# Patient Record
Sex: Female | Born: 1985 | Race: Black or African American | Hispanic: No | Smoking: Never smoker
Health system: Southern US, Community
[De-identification: ages and names within clinical notes are randomized; demographics above are authoritative.]

## PROBLEM LIST (undated history)

## (undated) ENCOUNTER — Inpatient Hospital Stay: Payer: Self-pay

## (undated) DIAGNOSIS — R519 Headache, unspecified: Secondary | ICD-10-CM

## (undated) DIAGNOSIS — I499 Cardiac arrhythmia, unspecified: Secondary | ICD-10-CM

## (undated) DIAGNOSIS — D649 Anemia, unspecified: Secondary | ICD-10-CM

## (undated) DIAGNOSIS — M1990 Unspecified osteoarthritis, unspecified site: Secondary | ICD-10-CM

## (undated) DIAGNOSIS — R011 Cardiac murmur, unspecified: Secondary | ICD-10-CM

## (undated) DIAGNOSIS — J45909 Unspecified asthma, uncomplicated: Secondary | ICD-10-CM

## (undated) DIAGNOSIS — F32A Depression, unspecified: Secondary | ICD-10-CM

## (undated) DIAGNOSIS — O1490 Unspecified pre-eclampsia, unspecified trimester: Secondary | ICD-10-CM

## (undated) DIAGNOSIS — O139 Gestational [pregnancy-induced] hypertension without significant proteinuria, unspecified trimester: Secondary | ICD-10-CM

## (undated) DIAGNOSIS — R87629 Unspecified abnormal cytological findings in specimens from vagina: Secondary | ICD-10-CM

## (undated) DIAGNOSIS — K219 Gastro-esophageal reflux disease without esophagitis: Secondary | ICD-10-CM

## (undated) DIAGNOSIS — M797 Fibromyalgia: Secondary | ICD-10-CM

## (undated) DIAGNOSIS — F329 Major depressive disorder, single episode, unspecified: Secondary | ICD-10-CM

## (undated) DIAGNOSIS — I209 Angina pectoris, unspecified: Secondary | ICD-10-CM

## (undated) DIAGNOSIS — F4310 Post-traumatic stress disorder, unspecified: Secondary | ICD-10-CM

## (undated) DIAGNOSIS — M5430 Sciatica, unspecified side: Secondary | ICD-10-CM

## (undated) HISTORY — PX: HERNIA REPAIR: SHX51

---
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
## 2006-04-15 ENCOUNTER — Observation Stay: Payer: Self-pay

## 2006-04-16 ENCOUNTER — Inpatient Hospital Stay: Payer: Self-pay

## 2010-05-24 ENCOUNTER — Emergency Department (HOSPITAL_COMMUNITY)
Admission: EM | Admit: 2010-05-24 | Discharge: 2010-05-24 | Payer: Self-pay | Source: Home / Self Care | Admitting: Emergency Medicine

## 2010-06-03 LAB — POCT I-STAT, CHEM 8
BUN: 14 mg/dL (ref 6–23)
Calcium, Ion: 1.18 mmol/L (ref 1.12–1.32)
Chloride: 106 mEq/L (ref 96–112)
Creatinine, Ser: 1.2 mg/dL (ref 0.4–1.2)
Glucose, Bld: 87 mg/dL (ref 70–99)
HCT: 42 % (ref 36.0–46.0)
Hemoglobin: 14.3 g/dL (ref 12.0–15.0)
Potassium: 3.7 mEq/L (ref 3.5–5.1)
Sodium: 139 mEq/L (ref 135–145)
TCO2: 25 mmol/L (ref 0–100)

## 2010-06-03 LAB — URINALYSIS, ROUTINE W REFLEX MICROSCOPIC
Bilirubin Urine: NEGATIVE
Hgb urine dipstick: NEGATIVE
Ketones, ur: NEGATIVE mg/dL
Nitrite: NEGATIVE
Protein, ur: NEGATIVE mg/dL
Specific Gravity, Urine: 1.026 (ref 1.005–1.030)
Urine Glucose, Fasting: NEGATIVE mg/dL
Urobilinogen, UA: 1 mg/dL (ref 0.0–1.0)
pH: 6 (ref 5.0–8.0)

## 2010-06-03 LAB — URINE MICROSCOPIC-ADD ON

## 2010-06-03 LAB — POCT PREGNANCY, URINE: Preg Test, Ur: NEGATIVE

## 2010-09-24 ENCOUNTER — Emergency Department (HOSPITAL_COMMUNITY)
Admission: EM | Admit: 2010-09-24 | Discharge: 2010-09-24 | Disposition: A | Discharge status: Home / Self Care | Payer: Self-pay | Attending: Emergency Medicine | Admitting: Emergency Medicine

## 2010-09-24 DIAGNOSIS — R112 Nausea with vomiting, unspecified: Secondary | ICD-10-CM | POA: Insufficient documentation

## 2010-09-24 DIAGNOSIS — R197 Diarrhea, unspecified: Secondary | ICD-10-CM | POA: Insufficient documentation

## 2011-04-14 ENCOUNTER — Emergency Department: Payer: Self-pay | Admitting: *Deleted

## 2011-05-01 ENCOUNTER — Emergency Department: Payer: Self-pay | Admitting: *Deleted

## 2011-08-23 ENCOUNTER — Emergency Department: Payer: Self-pay | Admitting: *Deleted

## 2011-08-23 LAB — CBC
HCT: 35.5 % (ref 35.0–47.0)
HGB: 11.8 g/dL — ABNORMAL LOW (ref 12.0–16.0)
MCH: 28 pg (ref 26.0–34.0)
MCHC: 33.2 g/dL (ref 32.0–36.0)
MCV: 84 fL (ref 80–100)
Platelet: 255 10*3/uL (ref 150–440)
RBC: 4.21 10*6/uL (ref 3.80–5.20)
RDW: 14.8 % — ABNORMAL HIGH (ref 11.5–14.5)
WBC: 8.6 10*3/uL (ref 3.6–11.0)

## 2011-08-24 LAB — BASIC METABOLIC PANEL
Anion Gap: 7 (ref 7–16)
BUN: 20 mg/dL — ABNORMAL HIGH (ref 7–18)
Calcium, Total: 8.6 mg/dL (ref 8.5–10.1)
Chloride: 106 mmol/L (ref 98–107)
Co2: 27 mmol/L (ref 21–32)
Creatinine: 1.21 mg/dL (ref 0.60–1.30)
EGFR (African American): >60
EGFR (Non-African Amer.): 58 — ABNORMAL LOW
Glucose: 82 mg/dL (ref 65–99)
Osmolality: 281 (ref 275–301)
Potassium: 3.9 mmol/L (ref 3.5–5.1)
Sodium: 140 mmol/L (ref 136–145)

## 2011-08-24 LAB — PRO B NATRIURETIC PEPTIDE: B-Type Natriuretic Peptide: 23 pg/mL (ref 0–125)

## 2011-08-24 LAB — TROPONIN I: Troponin-I: <0.02 ng/mL

## 2011-08-24 LAB — CK TOTAL AND CKMB (NOT AT ARMC)
CK, Total: 187 U/L (ref 21–215)
CK-MB: 0.8 ng/mL (ref 0.5–3.6)

## 2012-05-15 ENCOUNTER — Emergency Department: Payer: Self-pay | Admitting: Emergency Medicine

## 2012-05-15 LAB — CBC WITH DIFFERENTIAL/PLATELET
Basophil #: 0 10*3/uL (ref 0.0–0.1)
Basophil %: 0.2 %
Eosinophil #: 0 10*3/uL (ref 0.0–0.7)
Eosinophil %: 0 %
HCT: 37.2 % (ref 35.0–47.0)
HGB: 12.3 g/dL (ref 12.0–16.0)
Lymphocyte #: 0.4 10*3/uL — ABNORMAL LOW (ref 1.0–3.6)
Lymphocyte %: 4.6 %
MCH: 26.8 pg (ref 26.0–34.0)
MCHC: 32.9 g/dL (ref 32.0–36.0)
MCV: 81 fL (ref 80–100)
Monocyte #: 0.2 x10 3/mm (ref 0.2–0.9)
Monocyte %: 2.4 %
Neutrophil #: 7 10*3/uL — ABNORMAL HIGH (ref 1.4–6.5)
Neutrophil %: 92.8 %
Platelet: 272 10*3/uL (ref 150–440)
RBC: 4.58 10*6/uL (ref 3.80–5.20)
RDW: 14.6 % — ABNORMAL HIGH (ref 11.5–14.5)
WBC: 7.6 10*3/uL (ref 3.6–11.0)

## 2012-05-15 LAB — COMPREHENSIVE METABOLIC PANEL
Albumin: 3.4 g/dL (ref 3.4–5.0)
Alkaline Phosphatase: 111 U/L (ref 50–136)
Anion Gap: 8 (ref 7–16)
BUN: 17 mg/dL (ref 7–18)
Bilirubin,Total: 0.3 mg/dL (ref 0.2–1.0)
Calcium, Total: 8.5 mg/dL (ref 8.5–10.1)
Chloride: 106 mmol/L (ref 98–107)
Co2: 22 mmol/L (ref 21–32)
Creatinine: 1.02 mg/dL (ref 0.60–1.30)
EGFR (African American): >60
EGFR (Non-African Amer.): >60
Glucose: 108 mg/dL — ABNORMAL HIGH (ref 65–99)
Osmolality: 274 (ref 275–301)
Potassium: 3.8 mmol/L (ref 3.5–5.1)
SGOT(AST): 18 U/L (ref 15–37)
SGPT (ALT): 18 U/L (ref 12–78)
Sodium: 136 mmol/L (ref 136–145)
Total Protein: 7.4 g/dL (ref 6.4–8.2)

## 2012-05-15 LAB — RAPID INFLUENZA A&B ANTIGENS

## 2012-07-08 ENCOUNTER — Emergency Department: Payer: Self-pay | Admitting: Emergency Medicine

## 2012-07-08 LAB — PREGNANCY, URINE: Pregnancy Test, Urine: NEGATIVE m[IU]/mL

## 2012-07-08 LAB — CBC
HCT: 35.9 % (ref 35.0–47.0)
HGB: 11.8 g/dL — ABNORMAL LOW (ref 12.0–16.0)
MCH: 27.6 pg (ref 26.0–34.0)
MCHC: 32.8 g/dL (ref 32.0–36.0)
MCV: 84 fL (ref 80–100)
Platelet: 272 10*3/uL (ref 150–440)
RBC: 4.28 10*6/uL (ref 3.80–5.20)
RDW: 15.6 % — ABNORMAL HIGH (ref 11.5–14.5)
WBC: 9.6 10*3/uL (ref 3.6–11.0)

## 2012-07-08 LAB — COMPREHENSIVE METABOLIC PANEL
Albumin: 3.5 g/dL (ref 3.4–5.0)
Alkaline Phosphatase: 107 U/L (ref 50–136)
Anion Gap: 8 (ref 7–16)
BUN: 17 mg/dL (ref 7–18)
Bilirubin,Total: 0.2 mg/dL (ref 0.2–1.0)
Calcium, Total: 8.9 mg/dL (ref 8.5–10.1)
Chloride: 109 mmol/L — ABNORMAL HIGH (ref 98–107)
Co2: 25 mmol/L (ref 21–32)
Creatinine: 1.15 mg/dL (ref 0.60–1.30)
EGFR (African American): >60
EGFR (Non-African Amer.): >60
Glucose: 91 mg/dL (ref 65–99)
Osmolality: 284 (ref 275–301)
Potassium: 3.7 mmol/L (ref 3.5–5.1)
SGOT(AST): 18 U/L (ref 15–37)
SGPT (ALT): 24 U/L (ref 12–78)
Sodium: 142 mmol/L (ref 136–145)
Total Protein: 7.3 g/dL (ref 6.4–8.2)

## 2012-07-08 LAB — URINALYSIS, COMPLETE
Bilirubin,UR: NEGATIVE
Blood: NEGATIVE
Glucose,UR: NEGATIVE mg/dL (ref 0–75)
Ketone: NEGATIVE
Nitrite: POSITIVE
Ph: 5 (ref 4.5–8.0)
Protein: NEGATIVE
RBC,UR: 4 /HPF (ref 0–5)
Specific Gravity: 1.019 (ref 1.003–1.030)
Squamous Epithelial: 5
Transitional Epi: 6
WBC UR: 25 /HPF (ref 0–5)

## 2012-07-08 LAB — LIPASE, BLOOD: Lipase: 198 U/L (ref 73–393)

## 2013-01-05 ENCOUNTER — Emergency Department: Payer: Self-pay | Admitting: Emergency Medicine

## 2013-05-09 ENCOUNTER — Emergency Department: Payer: Self-pay | Admitting: Emergency Medicine

## 2013-05-09 LAB — RAPID INFLUENZA A&B ANTIGENS

## 2013-05-21 ENCOUNTER — Emergency Department: Payer: Self-pay | Admitting: Emergency Medicine

## 2013-05-21 LAB — URINALYSIS, COMPLETE
Bilirubin,UR: NEGATIVE
Glucose,UR: 50 mg/dL (ref 0–75)
Leukocyte Esterase: NEGATIVE
Nitrite: POSITIVE
Ph: 7 (ref 4.5–8.0)
Protein: NEGATIVE
RBC,UR: 1 /HPF (ref 0–5)
Specific Gravity: 1.019 (ref 1.003–1.030)
Squamous Epithelial: 14
WBC UR: 2 /HPF (ref 0–5)

## 2013-06-02 ENCOUNTER — Emergency Department: Payer: Self-pay | Admitting: Emergency Medicine

## 2013-06-02 LAB — COMPREHENSIVE METABOLIC PANEL
Albumin: 3.2 g/dL — ABNORMAL LOW (ref 3.4–5.0)
Alkaline Phosphatase: 77 U/L
Anion Gap: 2 — ABNORMAL LOW (ref 7–16)
BUN: 11 mg/dL (ref 7–18)
Bilirubin,Total: 0.3 mg/dL (ref 0.2–1.0)
Calcium, Total: 8.6 mg/dL (ref 8.5–10.1)
Chloride: 106 mmol/L (ref 98–107)
Co2: 28 mmol/L (ref 21–32)
Creatinine: 1.06 mg/dL (ref 0.60–1.30)
EGFR (African American): >60
EGFR (Non-African Amer.): >60
Glucose: 90 mg/dL (ref 65–99)
Osmolality: 271 (ref 275–301)
Potassium: 3.6 mmol/L (ref 3.5–5.1)
SGOT(AST): 20 U/L (ref 15–37)
SGPT (ALT): 20 U/L (ref 12–78)
Sodium: 136 mmol/L (ref 136–145)
Total Protein: 7.1 g/dL (ref 6.4–8.2)

## 2013-06-02 LAB — URINALYSIS, COMPLETE
Bilirubin,UR: NEGATIVE
Blood: NEGATIVE
Glucose,UR: NEGATIVE mg/dL (ref 0–75)
Ketone: NEGATIVE
Nitrite: NEGATIVE
Ph: 5 (ref 4.5–8.0)
Protein: NEGATIVE
RBC,UR: 7 /HPF (ref 0–5)
Specific Gravity: 1.021 (ref 1.003–1.030)
Squamous Epithelial: 19
WBC UR: 15 /HPF (ref 0–5)

## 2013-06-02 LAB — CBC
HCT: 34.5 % — ABNORMAL LOW (ref 35.0–47.0)
HGB: 11.5 g/dL — ABNORMAL LOW (ref 12.0–16.0)
MCH: 27 pg (ref 26.0–34.0)
MCHC: 33.2 g/dL (ref 32.0–36.0)
MCV: 81 fL (ref 80–100)
Platelet: 249 10*3/uL (ref 150–440)
RBC: 4.24 10*6/uL (ref 3.80–5.20)
RDW: 14.5 % (ref 11.5–14.5)
WBC: 6.1 10*3/uL (ref 3.6–11.0)

## 2013-06-02 LAB — MAGNESIUM: Magnesium: 1.8 mg/dL

## 2013-06-02 LAB — TSH: Thyroid Stimulating Horm: 0.947 u[IU]/mL

## 2013-06-02 LAB — PHOSPHORUS: Phosphorus: 3 mg/dL (ref 2.5–4.9)

## 2013-06-02 LAB — TROPONIN I: Troponin-I: <0.02 ng/mL

## 2013-06-30 ENCOUNTER — Emergency Department: Payer: Self-pay | Admitting: Emergency Medicine

## 2013-06-30 LAB — URINALYSIS, COMPLETE
Bacteria: NONE SEEN
Bilirubin,UR: NEGATIVE
Blood: NEGATIVE
Glucose,UR: NEGATIVE mg/dL (ref 0–75)
Ketone: NEGATIVE
Nitrite: NEGATIVE
Ph: 6 (ref 4.5–8.0)
Protein: NEGATIVE
RBC,UR: 1 /HPF (ref 0–5)
Specific Gravity: 1.025 (ref 1.003–1.030)
Squamous Epithelial: 7
WBC UR: 5 /HPF (ref 0–5)

## 2013-07-02 LAB — URINE CULTURE

## 2013-09-24 ENCOUNTER — Emergency Department: Payer: Self-pay | Admitting: Emergency Medicine

## 2013-09-24 LAB — CBC WITH DIFFERENTIAL/PLATELET
Basophil #: 0.1 10*3/uL (ref 0.0–0.1)
Basophil %: 0.7 %
Eosinophil #: 0.1 10*3/uL (ref 0.0–0.7)
Eosinophil %: 0.7 %
HCT: 36.5 % (ref 35.0–47.0)
HGB: 11.9 g/dL — ABNORMAL LOW (ref 12.0–16.0)
Lymphocyte #: 2 10*3/uL (ref 1.0–3.6)
Lymphocyte %: 25.4 %
MCH: 27.5 pg (ref 26.0–34.0)
MCHC: 32.7 g/dL (ref 32.0–36.0)
MCV: 84 fL (ref 80–100)
Monocyte #: 0.3 x10 3/mm (ref 0.2–0.9)
Monocyte %: 3.8 %
Neutrophil #: 5.3 10*3/uL (ref 1.4–6.5)
Neutrophil %: 69.4 %
Platelet: 258 10*3/uL (ref 150–440)
RBC: 4.35 10*6/uL (ref 3.80–5.20)
RDW: 14.2 % (ref 11.5–14.5)
WBC: 7.7 10*3/uL (ref 3.6–11.0)

## 2013-09-24 LAB — COMPREHENSIVE METABOLIC PANEL
Albumin: 3 g/dL — ABNORMAL LOW (ref 3.4–5.0)
Alkaline Phosphatase: 84 U/L
Anion Gap: 5 — ABNORMAL LOW (ref 7–16)
BUN: 12 mg/dL (ref 7–18)
Bilirubin,Total: 0.2 mg/dL (ref 0.2–1.0)
Calcium, Total: 8.8 mg/dL (ref 8.5–10.1)
Chloride: 105 mmol/L (ref 98–107)
Co2: 25 mmol/L (ref 21–32)
Creatinine: 0.72 mg/dL (ref 0.60–1.30)
EGFR (African American): >60
EGFR (Non-African Amer.): >60
Glucose: 98 mg/dL (ref 65–99)
Osmolality: 270 (ref 275–301)
Potassium: 3.7 mmol/L (ref 3.5–5.1)
SGOT(AST): 12 U/L — ABNORMAL LOW (ref 15–37)
SGPT (ALT): 14 U/L (ref 12–78)
Sodium: 135 mmol/L — ABNORMAL LOW (ref 136–145)
Total Protein: 7.2 g/dL (ref 6.4–8.2)

## 2013-09-24 LAB — URINALYSIS, COMPLETE
Bacteria: NONE SEEN
Bilirubin,UR: NEGATIVE
Blood: NEGATIVE
Glucose,UR: NEGATIVE mg/dL (ref 0–75)
Ketone: NEGATIVE
Nitrite: NEGATIVE
Ph: 6 (ref 4.5–8.0)
Protein: NEGATIVE
RBC,UR: 1 /HPF (ref 0–5)
Specific Gravity: 1.018 (ref 1.003–1.030)
Squamous Epithelial: 7
WBC UR: 1 /HPF (ref 0–5)

## 2013-09-24 LAB — HCG, QUANTITATIVE, PREGNANCY: Beta Hcg, Quant.: 33159 m[IU]/mL — ABNORMAL HIGH

## 2013-11-07 ENCOUNTER — Emergency Department: Payer: Self-pay | Admitting: Emergency Medicine

## 2013-11-07 LAB — URINALYSIS, COMPLETE
Bacteria: NONE SEEN
Bilirubin,UR: NEGATIVE
Blood: NEGATIVE
Glucose,UR: NEGATIVE mg/dL (ref 0–75)
Nitrite: NEGATIVE
Ph: 6 (ref 4.5–8.0)
Protein: NEGATIVE
RBC,UR: 3 /HPF (ref 0–5)
Specific Gravity: 1.028 (ref 1.003–1.030)
Squamous Epithelial: 9
WBC UR: 5 /HPF (ref 0–5)

## 2014-03-15 ENCOUNTER — Inpatient Hospital Stay: Payer: Self-pay

## 2014-03-15 DIAGNOSIS — O1490 Unspecified pre-eclampsia, unspecified trimester: Secondary | ICD-10-CM

## 2014-03-15 LAB — COMPREHENSIVE METABOLIC PANEL
Albumin: 2.4 g/dL — ABNORMAL LOW (ref 3.4–5.0)
Alkaline Phosphatase: 166 U/L — ABNORMAL HIGH
Anion Gap: 9 (ref 7–16)
BUN: 14 mg/dL (ref 7–18)
Bilirubin,Total: 0.2 mg/dL (ref 0.2–1.0)
Calcium, Total: 8.6 mg/dL (ref 8.5–10.1)
Chloride: 103 mmol/L (ref 98–107)
Co2: 26 mmol/L (ref 21–32)
Creatinine: 0.96 mg/dL (ref 0.60–1.30)
EGFR (African American): >60
EGFR (Non-African Amer.): >60
Glucose: 102 mg/dL — ABNORMAL HIGH (ref 65–99)
Osmolality: 276 (ref 275–301)
Potassium: 4.3 mmol/L (ref 3.5–5.1)
SGOT(AST): 24 U/L (ref 15–37)
SGPT (ALT): 23 U/L
Sodium: 138 mmol/L (ref 136–145)
Total Protein: 6.6 g/dL (ref 6.4–8.2)

## 2014-03-15 LAB — CBC WITH DIFFERENTIAL/PLATELET
Basophil #: 0 10*3/uL (ref 0.0–0.1)
Basophil #: 0 10*3/uL (ref 0.0–0.1)
Basophil %: 0.3 %
Basophil %: 0.2 %
Eosinophil #: 0.1 10*3/uL (ref 0.0–0.7)
Eosinophil #: 0.1 10*3/uL (ref 0.0–0.7)
Eosinophil %: 1.4 %
Eosinophil %: 0.8 %
HCT: 34.8 % — ABNORMAL LOW (ref 35.0–47.0)
HCT: 36.9 % (ref 35.0–47.0)
HGB: 11.1 g/dL — ABNORMAL LOW (ref 12.0–16.0)
HGB: 11.7 g/dL — ABNORMAL LOW (ref 12.0–16.0)
Lymphocyte #: 2 10*3/uL (ref 1.0–3.6)
Lymphocyte #: 1.7 10*3/uL (ref 1.0–3.6)
Lymphocyte %: 20 %
Lymphocyte %: 18.3 %
MCH: 26.3 pg (ref 26.0–34.0)
MCH: 26.4 pg (ref 26.0–34.0)
MCHC: 31.9 g/dL — ABNORMAL LOW (ref 32.0–36.0)
MCHC: 31.8 g/dL — ABNORMAL LOW (ref 32.0–36.0)
MCV: 82 fL (ref 80–100)
MCV: 83 fL (ref 80–100)
Monocyte #: 0.5 x10 3/mm (ref 0.2–0.9)
Monocyte #: 0.6 x10 3/mm (ref 0.2–0.9)
Monocyte %: 5.6 %
Monocyte %: 6.4 %
Neutrophil #: 7.2 10*3/uL — ABNORMAL HIGH (ref 1.4–6.5)
Neutrophil #: 7 10*3/uL — ABNORMAL HIGH (ref 1.4–6.5)
Neutrophil %: 72.7 %
Neutrophil %: 74.3 %
Platelet: 231 10*3/uL (ref 150–440)
Platelet: 260 10*3/uL (ref 150–440)
RBC: 4.22 10*6/uL (ref 3.80–5.20)
RBC: 4.45 10*6/uL (ref 3.80–5.20)
RDW: 15.4 % — ABNORMAL HIGH (ref 11.5–14.5)
RDW: 15.6 % — ABNORMAL HIGH (ref 11.5–14.5)
WBC: 9.9 10*3/uL (ref 3.6–11.0)
WBC: 9.4 10*3/uL (ref 3.6–11.0)

## 2014-03-15 LAB — PROTEIN / CREATININE RATIO, URINE
Creatinine, Urine: 122.5 mg/dL (ref 30.0–125.0)
Protein, Random Urine: 32 mg/dL — ABNORMAL HIGH (ref 0–12)
Protein/Creat. Ratio: 261 mg/gCREAT — ABNORMAL HIGH (ref 0–200)

## 2014-03-16 LAB — HEMATOCRIT: HCT: 32.3 % — ABNORMAL LOW (ref 35.0–47.0)

## 2014-05-19 NOTE — L&D Delivery Note (Addendum)
Obstetrical Delivery Note   Date of Delivery:   04/10/2015 Primary OB:   [facility] Gestational Age/EDD: 518w5d  Antepartum complications: BMI 39, h/o HSV, h/o  depression and PTSD, chronic sciatic pain, migraines, h/o pre-eclampsia, close interval pregnancy  Delivered By:    Carlean JewsMeredith Sigmon, CNM  Delivery Type:   spontaneous vaginal delivery  Delivery Details:   The call room was phoned and I was told by another RN that the patient had delivered easily and without complications.  When I walked into the room, CNM Sigmon was there with gloves and told me that she supervised the primary RN Candice Camp(Cynthia Dougherty) delivery the infant.      The delayed cord clamping was done and the cord clamped and cut by me.  Anesthesia:    local Intrapartum complications: Prolonged rupture of membranes GBS:    Negative Laceration:    1st degree repaired Episiotomy:    none Placenta:    Delivered and expressed via active management. Intact: yes. To pathology: no.  Estimated Blood Loss:  200mL  Baby:    Liveborn female, APGARs 8/9, weight 2670gm  Cornelia Copaharlie Khushboo Chuck, Jr. MD [facility] Pager [phone]

## 2014-08-23 LAB — OB RESULTS CONSOLE HIV ANTIBODY (ROUTINE TESTING): HIV: NONREACTIVE

## 2014-09-17 ENCOUNTER — Encounter: Payer: Self-pay | Admitting: Emergency Medicine

## 2014-09-17 ENCOUNTER — Emergency Department
Admission: EM | Admit: 2014-09-17 | Discharge: 2014-09-17 | Disposition: A | Discharge status: Home / Self Care | Payer: Medicaid Other | Attending: Emergency Medicine | Admitting: Emergency Medicine

## 2014-09-17 DIAGNOSIS — J9801 Acute bronchospasm: Secondary | ICD-10-CM | POA: Diagnosis not present

## 2014-09-17 DIAGNOSIS — O99511 Diseases of the respiratory system complicating pregnancy, first trimester: Secondary | ICD-10-CM | POA: Insufficient documentation

## 2014-09-17 DIAGNOSIS — O210 Mild hyperemesis gravidarum: Secondary | ICD-10-CM | POA: Diagnosis not present

## 2014-09-17 DIAGNOSIS — R05 Cough: Secondary | ICD-10-CM

## 2014-09-17 DIAGNOSIS — Z87891 Personal history of nicotine dependence: Secondary | ICD-10-CM | POA: Insufficient documentation

## 2014-09-17 DIAGNOSIS — Z79899 Other long term (current) drug therapy: Secondary | ICD-10-CM | POA: Insufficient documentation

## 2014-09-17 DIAGNOSIS — Z91040 Latex allergy status: Secondary | ICD-10-CM | POA: Diagnosis not present

## 2014-09-17 DIAGNOSIS — R059 Cough, unspecified: Secondary | ICD-10-CM

## 2014-09-17 DIAGNOSIS — O9989 Other specified diseases and conditions complicating pregnancy, childbirth and the puerperium: Secondary | ICD-10-CM | POA: Diagnosis present

## 2014-09-17 DIAGNOSIS — Z3A08 8 weeks gestation of pregnancy: Secondary | ICD-10-CM | POA: Insufficient documentation

## 2014-09-17 MED ORDER — ALBUTEROL SULFATE (2.5 MG/3ML) 0.083% IN NEBU
INHALATION_SOLUTION | RESPIRATORY_TRACT | Status: AC
  Administered 2014-09-17: 2.5 mg
  Filled 2014-09-17: qty 3

## 2014-09-17 MED ORDER — ALBUTEROL SULFATE HFA 108 (90 BASE) MCG/ACT IN AERS: 2.0000 | INHALATION_SPRAY | RESPIRATORY_TRACT | Status: DC | PRN

## 2014-09-17 MED ORDER — ALBUTEROL SULFATE (2.5 MG/3ML) 0.083% IN NEBU: 2.5000 mg | INHALATION_SOLUTION | Freq: Once | RESPIRATORY_TRACT | Status: AC

## 2014-09-17 NOTE — ED Notes (Signed)
E-sign not working in room. Pt signed copy of discharge instructions. Discharge instructions forward Chartered loss adjusterti secretary for future scanning.  Pt verbalized understanding of discharge instructions. Pt rates pain 8/10 at this time.  No other medical needs verbalized at this time.

## 2014-09-17 NOTE — ED Notes (Signed)
Pt presents to ER stating coughing and then vomiting x 3 days.

## 2014-09-17 NOTE — Discharge Instructions (Signed)
1. USE ALBUTEROL INHALER 2 PUFFS EVERY 4 HOURS AS NEEDED FOR COUGH. 2. RETURN TO THE ER FOR WORSENING SYMPTOMS, PERSISTENT VOMITING, DIFFICULTY BREATHING OR OTHER CONCERNS.  Bronchospasm A bronchospasm is a spasm or tightening of the airways going into the lungs. During a bronchospasm breathing becomes more difficult because the airways get smaller. When this happens there can be coughing, a whistling sound when breathing (wheezing), and difficulty breathing. Bronchospasm is often associated with asthma, but not all patients who experience a bronchospasm have asthma. CAUSES  A bronchospasm is caused by inflammation or irritation of the airways. The inflammation or irritation may be triggered by:   Allergies (such as to animals, pollen, food, or mold). Allergens that cause bronchospasm may cause wheezing immediately after exposure or many hours later.   Infection. Viral infections are believed to be the most common cause of bronchospasm.   Exercise.   Irritants (such as pollution, cigarette smoke, strong odors, aerosol sprays, and paint fumes).   Weather changes. Winds increase molds and pollens in the air. Rain refreshes the air by washing irritants out. Cold air may cause inflammation.   Stress and emotional upset.  SIGNS AND SYMPTOMS   Wheezing.   Excessive nighttime coughing.   Frequent or severe coughing with a simple cold.   Chest tightness.   Shortness of breath.  DIAGNOSIS  Bronchospasm is usually diagnosed through a history and physical exam. Tests, such as chest X-rays, are sometimes done to look for other conditions. TREATMENT   Inhaled medicines can be given to open up your airways and help you breathe. The medicines can be given using either an inhaler or a nebulizer machine.  Corticosteroid medicines may be given for severe bronchospasm, usually when it is associated with asthma. HOME CARE INSTRUCTIONS   Always have a plan prepared for seeking medical  care. Know when to call your health care provider and local emergency services (911 in the [place] Know where you can access local emergency care.  Only take medicines as directed by your health care provider.  If you were prescribed an inhaler or nebulizer machine, ask your health care provider to explain how to use it correctly. Always use a spacer with your inhaler if you were given one.  It is necessary to remain calm during an attack. Try to relax and breathe more slowly.  Control your home environment in the following ways:   Change your heating and air conditioning filter at least once a month.   Limit your use of fireplaces and wood stoves.  Do not smoke and do not allow smoking in your home.   Avoid exposure to perfumes and fragrances.   Get rid of pests (such as roaches and mice) and their droppings.   Throw away plants if you see mold on them.   Keep your house clean and dust free.   Replace carpet with wood, tile, or vinyl flooring. Carpet can trap dander and dust.   Use allergy-proof pillows, mattress covers, and box spring covers.   Wash bed sheets and blankets every week in hot water and dry them in a dryer.   Use blankets that are made of polyester or cotton.   Wash hands frequently. SEEK MEDICAL CARE IF:   You have muscle aches.   You have chest pain.   The sputum changes from clear or white to yellow, green, gray, or bloody.   The sputum you cough up gets thicker.   There are problems that may be related to  the medicine you are given, such as a rash, itching, swelling, or trouble breathing.  SEEK IMMEDIATE MEDICAL CARE IF:   You have worsening wheezing and coughing even after taking your prescribed medicines.   You have increased difficulty breathing.   You develop severe chest pain. MAKE SURE YOU:   Understand these instructions.  Will watch your condition.  Will get help right away if you are not doing well or get  worse. Document Released: 05/08/2003 Document Revised: 05/10/2013 Document Reviewed: 10/25/2012 [facility] Patient Information 2015 [place], [place]. This information is not intended to replace advice given to you by your health care provider. Make sure you discuss any questions you have with your health care provider.  Cough, Adult  A cough is a reflex that helps clear your throat and airways. It can help heal the body or may be a reaction to an irritated airway. A cough may only last 2 or 3 weeks (acute) or may last more than 8 weeks (chronic).  CAUSES Acute cough:  Viral or bacterial infections. Chronic cough:  Infections.  Allergies.  Asthma.  Post-nasal drip.  Smoking.  Heartburn or acid reflux.  Some medicines.  Chronic lung problems (COPD).  Cancer. SYMPTOMS   Cough.  Fever.  Chest pain.  Increased breathing rate.  High-pitched whistling sound when breathing (wheezing).  Colored mucus that you cough up (sputum). TREATMENT   A bacterial cough may be treated with antibiotic medicine.  A viral cough must run its course and will not respond to antibiotics.  Your caregiver may recommend other treatments if you have a chronic cough. HOME CARE INSTRUCTIONS   Only take over-the-counter or prescription medicines for pain, discomfort, or fever as directed by your caregiver. Use cough suppressants only as directed by your caregiver.  Use a cold steam vaporizer or humidifier in your bedroom or home to help loosen secretions.  Sleep in a semi-upright position if your cough is worse at night.  Rest as needed.  Stop smoking if you smoke. SEEK IMMEDIATE MEDICAL CARE IF:   You have pus in your sputum.  Your cough starts to worsen.  You cannot control your cough with suppressants and are losing sleep.  You begin coughing up blood.  You have difficulty breathing.  You develop pain which is getting worse or is uncontrolled with medicine.  You have a  fever. MAKE SURE YOU:   Understand these instructions.  Will watch your condition.  Will get help right away if you are not doing well or get worse. Document Released: 11/01/2010 Document Revised: 07/28/2011 Document Reviewed: 11/01/2010 [facility] Patient Information 2015 [place], [place]. This information is not intended to replace advice given to you by your health care provider. Make sure you discuss any questions you have with your health care provider.

## 2014-09-17 NOTE — ED Notes (Signed)
Pt states that she take a nausea med; 2 tabs po qhs but doesn't know the med name

## 2014-09-17 NOTE — ED Notes (Signed)
Pt came to ER from home with significant other, reporting cough that is causing patient to have sore throat and abdominal pain rated at 7/10 when pt coughs. Pt reports being 8 weeks and 3 days pregnant at this time. Pt verbalizes in full sentences. No other medical needs verbalized at this time.  Pt reports "reading on the internet that pregnant women can sometimes get a cough". Pt requested and given cup of ice.

## 2014-09-17 NOTE — ED Provider Notes (Signed)
[facility] Emergency Department Provider Note    ____________________________________________  Time seen: 73  I have reviewed the triage vital signs and the nursing notes.   HISTORY  Chief Complaint Cough       HPI Robin Myers is a 29 y.o. female G3 P2 AB0 approximately 8 weeks and 3 days pregnant who presents withcough 1 day. States she is coughing with posttussive emesis. Denies fevers, chills, shortness of breath, chest pain. States cough is causing her to have a sore throat. Denies abdominal pain, vaginal bleeding, vaginal discharge.    No past medical history on file.  There are no active problems to display for this patient.   No past surgical history on file.  Current Outpatient Rx  Name  Route  Sig  Dispense  Refill  . Prenatal Vit-DSS-Fe Fum-FA (PRENATAL 19) tablet   Oral   Take 1 tablet by mouth daily.         Marland Kitchen albuterol (PROVENTIL HFA;VENTOLIN HFA) 108 (90 BASE) MCG/ACT inhaler   Inhalation   Inhale 2 puffs into the lungs every 4 (four) hours as needed for wheezing or shortness of breath.   1 Inhaler   2     Allergies Latex  No family history on file.  Social History History  Substance Use Topics  . Smoking status: Former Games developer  . Smokeless tobacco: Not on file  . Alcohol Use: No    Review of Systems  Constitutional: Negative for fever. Eyes: Negative for visual changes. ENT: Positive for sore throat due to cough. Cardiovascular: Negative for chest pain. Respiratory: Negative for shortness of breath. Positive for nonproductive cough with occasional posttussive emesis. Gastrointestinal: Negative for abdominal pain and diarrhea. Genitourinary: Negative for dysuria. Musculoskeletal: Negative for back pain. Skin: Negative for rash. Neurological: Negative for headaches, focal weakness or numbness.   10-point ROS otherwise negative.  ____________________________________________   PHYSICAL  EXAM:  VITAL SIGNS: ED Triage Vitals  Enc Vitals Group     BP 09/17/14 0144 152/92 mmHg     Pulse Rate 09/17/14 0144 83     Resp 09/17/14 0144 22     Temp 09/17/14 0144 98.5 F (36.9 C)     Temp Source 09/17/14 0144 Oral     SpO2 09/17/14 0144 99 %     Weight 09/17/14 0144 200 lb (90.719 kg)     Height 09/17/14 0144  (1.651 m)     Head Cir --      Peak Flow --      Pain Score 09/17/14 0312 7     Pain Loc --      Pain Edu? --      Excl. in GC? --     Constitutional: Alert and oriented. Well appearing and in no distress. Eyes: Conjunctivae are normal. PERRL. Normal extraocular movements. ENT   Head: Normocephalic and atraumatic.   Nose: No congestion/rhinnorhea.   Mouth/Throat: Mucous membranes are moist. No pharyngeal erythema.   Neck: No stridor. Hematological/Lymphatic/Immunilogical: No cervical lymphadenopathy. Cardiovascular: Normal rate, regular rhythm. Normal and symmetric distal pulses are present in all extremities. No murmurs, rubs, or gallops. Respiratory: Normal respiratory effort without tachypnea nor retractions. Breath sounds are clear and equal bilaterally. No wheezes/rales/rhonchi. Active dry cough. Gastrointestinal: Soft and nontender. No distention. No abdominal bruits. There is no CVA tenderness. Genitourinary: Deferred Musculoskeletal: Nontender with normal range of motion in all extremities. No joint effusions.   Right lower leg:  No tenderness or edema.   Left lower  leg:  No tenderness or edema. Neurologic:  Normal speech and language. No gross focal neurologic deficits are appreciated. Speech is normal. No gait instability. Skin:  Skin is warm, dry and intact. No rash noted. Psychiatric: Mood and affect are normal. Speech and behavior are normal. Patient exhibits appropriate insight and judgment.  ____________________________________________   EKG  None  ____________________________________________     RADIOLOGY  None  ____________________________________________   PROCEDURES  Procedure(s) performed: None  Critical Care performed: None  ____________________________________________   INITIAL IMPRESSION / ASSESSMENT AND PLAN / ED COURSE  Pertinent labs & imaging results that were available during my care of the patient were reviewed by me and considered in my medical decision making (see chart for details).  29 year old female approximately [redacted] weeks pregnant presenting with dry cough causing occasional posttussive emesis. Afebrile, lungs clear to auscultation bilaterally, room air sats 100%. Active dry cough noted on exam. Discussed with patient could shield her abdomen to obtain chest x-ray; patient agrees to hold given benign lung exam. Will administer albuterol nebulizer for cough and reassess.  ----------------------------------------- 5:54 AM on 09/17/2014 -----------------------------------------  Patient improved after albuterol neb. Coughing abated. Mild tachycardia noted status post nebulizer treatment likely secondary to albuterol. Patient resting in no acute distress asking for third ice cream cup. Discussed with patient and given strict return precautions. Patient verbalizes understanding and agrees. ____________________________________________   FINAL CLINICAL IMPRESSION(S) / ED DIAGNOSES  Final diagnoses:  Cough  Bronchospasm     Irean HongJade J Erryn Dickison, MD 09/17/14 [phone]

## 2014-09-26 NOTE — H&P (Signed)
L&D Evaluation:  History:  HPI 29 year old G2 P1001 with EDC=04/12/2014 by an 11 week ultrasound presents at 28 weeks with decreased FM and nausea with vomiting blood tinged emesis early this AM. Has had nausea, frontal headache, and scotomata x 1 week. BP 150/90 and 140/93 on arrival. Has had reflux and heartburn thru her entire pregnancy along with nausea. Tums and Zantac no longer effective. Vomits occasionally, but this is the first time there was blood in emesis. Denies nosebleeds, nasal congestion, or rhinorrhea. Last took Tylenol for headache 2 days ago-but not effective. Feeling some contractions this AM. No VB or LOF or vulvar sores/itching. [facility] at [facility] weeks, anemia, tachycardia/SOB (had recent echo by Dr Milta DeitersS Khan and was told there should not be a problem with pt delivering vaginally), and an elevated one hr GTT with a 3 hr GTT showing a mildly elevated fasting. 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 O POS, RI, VI. Received TDAP at 28 weeks and flu shot at 28 weeks G1-delivered at home   Presents with decreased fetal movement, nausea/vomiting, blood tinged emesis.   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   Patient's Surgical History Hernia repair   Medications Zantac 150 mgm, Tums, Tylenol   Allergies Latex   Social History tobacco  Quit smoking during pregnancy.   Family History Non-Contributory   ROS:  ROS see HPI   Exam:  Vital Signs 140/93, 148/90, 140/92, 132/89, 129/87, 125/75   Urine Protein 261 mgm protein in P/C ratio   General no apparent distress   Mental Status clear   Chest clear   Heart normal sinus rhythm, no murmur/gallop/rubs   Abdomen fundus NT, tender LUS on exam, mild RUQ tenderness, BS active   Estimated Fetal Weight 5 1/2#   Fetal Position cephalic   Edema 1+  Nonpitting   Reflexes 1+   Pelvic no external lesions,  1+/40%/-2   Mebranes Intact, AFI=9.42cm   FHT normal rate with no decels, 140s with accels to 160s-Cat 1   Ucx irregular, mild   Skin dry   Other UA=5.7, 18 BUN, 1.07cr, hct 34.8%, plt=231   Impression:  Impression IUP at 36 weeks with preeclampsia vs g htn.  Probable Mallory Weiss tear/reflux.   Plan:  Plan EFM/NST, monitor contractions and for cervical change, clear fluids. Consulted Dr Bluford Kaufmannh, gastroenterology, who recommended Protonix 40 mgm IV q12, Maalox as needed, and clear liquids.   Comments 1200 While talking to Dr Bluford KaufmannH, paient had an episode of substernal pain and reflux. Pulse O2 was 100%, but BP 169/100. EKG was done which was read by the machine as sinus rhythm with PACs and ?septal infarct. Needs to be read by heart station physician. The episode resolved quickly. BP 153/97. Protonix was started.   Electronic Signatures: Trinna BalloonGutierrez, Dyonna Jaspers L (CNM)  (Signed 28-Oct-15 15:00)  Authored: L&D Evaluation   Last Updated: 28-Oct-15 15:00 by Trinna BalloonGutierrez, Jamaira Sherk L (CNM)

## 2014-11-22 LAB — OB RESULTS CONSOLE ANTIBODY SCREEN: Antibody Screen: NEGATIVE

## 2014-11-22 LAB — OB RESULTS CONSOLE ABO/RH: RH TYPE: POSITIVE

## 2014-11-22 LAB — OB RESULTS CONSOLE RUBELLA ANTIBODY, IGM: RUBELLA: IMMUNE

## 2014-11-22 LAB — OB RESULTS CONSOLE VARICELLA ZOSTER ANTIBODY, IGG: VARICELLA IGG: IMMUNE

## 2014-11-22 LAB — OB RESULTS CONSOLE HEPATITIS B SURFACE ANTIGEN: Hepatitis B Surface Ag: NEGATIVE

## 2014-12-13 ENCOUNTER — Emergency Department (HOSPITAL_COMMUNITY)
Admission: EM | Admit: 2014-12-13 | Discharge: 2014-12-13 | Disposition: A | Discharge status: Home / Self Care | Payer: Medicaid Other | Attending: Emergency Medicine | Admitting: Emergency Medicine

## 2014-12-13 ENCOUNTER — Encounter (HOSPITAL_COMMUNITY): Payer: Self-pay | Admitting: *Deleted

## 2014-12-13 DIAGNOSIS — W1839XA Other fall on same level, initial encounter: Secondary | ICD-10-CM | POA: Diagnosis not present

## 2014-12-13 DIAGNOSIS — Y9389 Activity, other specified: Secondary | ICD-10-CM | POA: Insufficient documentation

## 2014-12-13 DIAGNOSIS — G5701 Lesion of sciatic nerve, right lower limb: Secondary | ICD-10-CM | POA: Insufficient documentation

## 2014-12-13 DIAGNOSIS — Y9289 Other specified places as the place of occurrence of the external cause: Secondary | ICD-10-CM | POA: Diagnosis not present

## 2014-12-13 DIAGNOSIS — Z79899 Other long term (current) drug therapy: Secondary | ICD-10-CM | POA: Diagnosis not present

## 2014-12-13 DIAGNOSIS — M549 Dorsalgia, unspecified: Secondary | ICD-10-CM | POA: Insufficient documentation

## 2014-12-13 DIAGNOSIS — Y998 Other external cause status: Secondary | ICD-10-CM | POA: Insufficient documentation

## 2014-12-13 DIAGNOSIS — Z91040 Latex allergy status: Secondary | ICD-10-CM | POA: Diagnosis not present

## 2014-12-13 DIAGNOSIS — Z87891 Personal history of nicotine dependence: Secondary | ICD-10-CM | POA: Diagnosis not present

## 2014-12-13 DIAGNOSIS — S3991XA Unspecified injury of abdomen, initial encounter: Secondary | ICD-10-CM | POA: Diagnosis present

## 2014-12-13 NOTE — ED Notes (Signed)
Pt reports being approx [redacted] weeks pregnant and having frequent falls due to sciatica and her "leg gives out on her." pt reports falling two weeks ago, has seen her ob/gyn after that fall, fell again last night and this am. Now having lower abd pain, denies any bleeding.

## 2014-12-13 NOTE — Discharge Instructions (Signed)
Piriformis Syndrome with Rehab Piriformis syndrome is a condition the affects the nervous system in the area of the hip, and is characterized by pain and possibly a loss of feeling in the backside (posterior) thigh that may extend down the entire length of the leg. The symptoms are caused by an increase in pressure on the sciatic nerve by the piriformis muscle, which is on the back of the hip and is responsible for externally rotating the hip. The sciatic nerve and its branches connect to much of the leg. Normally the sciatic nerve runs between the piriformis muscle and other muscles. However, in certain individuals the nerve runs through the muscle, which causes an increase in pressure on the nerve and results in the symptoms of piriformis syndrome. SYMPTOMS   Pain, tingling, numbness, or burning in the back of the thigh that may also extend down the entire leg.  Occasionally, tenderness in the buttock.  Loss of function of the leg.  Pain that worsens when using the piriformis muscle (running, jumping, or stairs).  Pain that increases with prolonged sitting.  Pain that is lessened by lying flat on the back. CAUSES   Piriformis syndrome is the result of an increase in pressure placed on the sciatic nerve. Oftentimes, piriformis syndrome is an overuse injury.  Stress placed on the nerve from a sudden increase in the intensity, frequency, or duration of training.  Compensation of other extremity injuries. RISK INCREASES WITH:  Sports that involve the piriformis muscle (running, walking, or jumping).  You are born with (congenital) a defect in which the sciatic nerve passes through the muscle. PREVENTION  Warm up and stretch properly before activity.  Allow for adequate recovery between workouts.  Maintain physical fitness:  Strength, flexibility, and endurance.  Cardiovascular fitness. PROGNOSIS  If treated properly, the symptoms of piriformis syndrome usually resolve in 2 to 6  weeks. RELATED COMPLICATIONS   Persistent and possibly permanent pain and numbness in the lower extremity.  Weakness of the extremity that may progress to disability and inability to compete. TREATMENT  The most effective treatment for piriformis syndrome is rest from any activities that aggravate the symptoms. Ice and pain medication may help reduce pain and inflammation. The use of strengthening and stretching exercises may help reduce pain with activity. These exercises may be performed at home or with a therapist. A referral to a therapist may be given for further evaluation and treatment, such as ultrasound. Corticosteroid injections may be given to reduce inflammation that is causing pressure to be placed on the sciatic nerve. If nonsurgical (conservative) treatment is unsuccessful, then surgery may be recommended.  MEDICATION   If pain medication is necessary, then nonsteroidal anti-inflammatory medications, such as aspirin and ibuprofen, or other minor pain relievers, such as acetaminophen, are often recommended.  Do not take pain medication for 7 days before surgery.  Prescription pain relievers may be given if deemed necessary by your caregiver. Use only as directed and only as much as you need.  Corticosteroid injections may be given by your caregiver. These injections should be reserved for the most serious cases, because they may only be given a certain number of times. HEAT AND COLD:   Cold treatment (icing) relieves pain and reduces inflammation. Cold treatment should be applied for 10 to 15 minutes every 2 to 3 hours for inflammation and pain and immediately after any activity that aggravates your symptoms. Use ice packs or massage the area with a piece of ice (ice massage).  Heat   treatment may be used prior to performing the stretching and strengthening activities prescribed by your caregiver, physical therapist, or athletic trainer. Use a heat pack or soak the injury in warm  water. SEEK IMMEDIATE MEDICAL CARE IF:  Treatment seems to offer no benefit, or the condition worsens.  Any medications produce adverse side effects. EXERCISES RANGE OF MOTION (ROM) AND STRETCHING EXERCISES - Piriformis Syndrome These exercises may help you when beginning to rehabilitate your injury. Your symptoms may resolve with or without further involvement from your physician, physical therapist, or athletic trainer. While completing these exercises, remember:   Restoring tissue flexibility helps normal motion to return to the joints. This allows healthier, less painful movement and activity.  An effective stretch should be held for at least 30 seconds.  A stretch should never be painful. You should only feel a gentle lengthening or release in the stretched tissue. STRETCH - Hip Rotators  Lie on your back on a firm surface. Grasp your right / left knee with your right / left hand and your ankle with your opposite hand.  Keeping your hips and shoulders firmly planted, gently pull your right / left knee and rotate your lower leg toward your opposite shoulder until you feel a stretch in your buttocks.  Hold this stretch for __________ seconds. Repeat this stretch __________ times. Complete this stretch __________ times per day. STRETCH - Iliotibial Band  On the floor or bed, lie on your side so your right / left leg is on top. Bend your knee and grab your ankle.  Slowly bring your knee back so that your thigh is in line with your trunk. Keep your heel at your buttocks and gently arch your back so your head, shoulders, and hips line up.  Slowly lower your leg so that your knee approaches the floor/bed until you feel a gentle stretch on the outside of your right / left thigh. If you do not feel a stretch and your knee will not fall farther, place the heel of your opposite foot on top of your knee and pull your thigh down farther.  Hold this stretch for __________ seconds. Repeat  __________ times. Complete __________ times per day. STRENGTHENING EXERCISES - Piriformis Syndrome  These are some of the caregiver again or until your symptoms are resolved. Remember:   Strong muscles with good endurance tolerate stress better.  Do the exercises as initially prescribed by your caregiver. Progress slowly with each exercise, gradually increasing the number of repetitions and weight used under their guidance. STRENGTH - Hip Abductors, Straight Leg Raises Be aware of your form throughout the entire exercise so that you exercise the correct muscles. Sloppy form means that you are not strengthening the correct muscles.  Lie on your side so that your head, shoulders, knee, and hip line up. You may bend your lower knee to help maintain your balance. Your right / left leg should be on top.  Roll your hips slightly forward, so that your hips are stacked directly over each other and your right / left knee is facing forward.  Lift your top leg up 4-6 inches, leading with your heel. Be sure that your foot does not drift forward or that your knee does not roll toward the ceiling.  Hold this position for __________ seconds. You should feel the muscles in your outer hip lifting (you may not notice this until your leg begins to tire).  Slowly lower your leg to the starting position. Allow the muscles to fully   relax before beginning the next repetition. Repeat __________ times. Complete this exercise __________ times per day.  STRENGTH - Hip Abductors, Quadruped  On a firm, lightly padded surface, position yourself on your hands and knees. Your hands should be directly below your shoulders and your knees should be directly below your hips.  Keeping your right / left knee bent, lift your leg out to the side. Keep your legs level and in line with your shoulders.  Position yourself on your hands and knees.  Hold for __________ seconds.  Keeping your trunk steady and your hips level, slowly  lower your leg to the starting position. Repeat __________ times. Complete this exercise __________ times per day.  STRENGTH - Hip Abductors, Standing  Tie one end of a rubber exercise band/tubing to a secure surface (table, pole) and tie a loop at the other end.  Place the loop around your right / left ankle. Keeping your ankle with the band directly opposite of the secured end, step away until there is tension in the tube/band.  Hold onto a chair as needed for balance.  Keeping your back upright, your shoulders over your hips, and your toes pointing forward, lift your right / left leg out to your side. Be sure to lift your leg with your hip muscles. Do not "throw" your leg or tip your body to lift your leg.  Slowly and with control, return to the starting position. Repeat exercise __________ times. Complete this exercise __________ times per day.  Document Released: 05/05/2005 Document Revised: 09/19/2013 Document Reviewed: 08/17/2008 [employer] Patient Information 2015 [employer]. This information is not intended to replace advice given to you by your health care provider. Make sure you discuss any questions you have with your health care provider.  

## 2014-12-13 NOTE — ED Provider Notes (Signed)
CSN: [id number]     Arrival date & time 12/13/14  1256 History   First MD Initiated Contact with Robin Myers 12/13/14 1316     Chief Complaint  Robin Myers presents with  . Fall  . Abdominal Pain     (Consider location/radiation/quality/duration/timing/severity/associated sxs/prior Treatment) Robin Myers is a 29 y.o. female presenting with fall and abdominal pain. The history is provided by the Robin Myers.  Fall This is a new problem. The current episode started 6 to 12 hours ago. The problem occurs every several days. The problem has not changed since onset.Associated symptoms include abdominal pain (right iliac crest pain). Pertinent negatives include no chest pain, no headaches and no shortness of breath. Nothing aggravates the symptoms. Nothing relieves the symptoms. She has tried nothing for the symptoms. The treatment provided no relief.  Abdominal Pain Associated symptoms: no chest pain, no chills, no dysuria, no fever, no nausea, no shortness of breath and no vomiting    29 year old female G3P2 with a chief complaint of multiple falls and right-sided low back pain. Robin Myers states is been going on for at least a year. Robin Myers has seen chiropractors for this with worsening of symptoms. Robin Myers has sciatica on that side that radiates down to her foot. Robin Myers states that when the pain is severe she drops to the ground usually while she's holding one of her children. Robin Myers struck her head about a week ago saw her OB with no significant issues. Robin Myers fell again this morning, fall from standing onto her right buttock. Robin Myers denies any loss of consciousness complaining of worsening right-sided lower back pain. Robin Myers states she has some pain that radiates around her back into her lower abdomen. Robin Myers has had some mild cramping denies vaginal bleeding/discharge. Robin Myers denies dysuria.  History reviewed. No pertinent past medical history. History reviewed. No pertinent past surgical history. History  reviewed. No pertinent family history. History  Substance Use Topics  . Smoking status: Former Games developer  . Smokeless tobacco: Not on file  . Alcohol Use: No   OB History    Gravida Para Term Preterm AB TAB SAB Ectopic Multiple Living   1              Review of Systems  Constitutional: Negative for fever and chills.  HENT: Negative for congestion and rhinorrhea.   Eyes: Negative for redness and visual disturbance.  Respiratory: Negative for shortness of breath and wheezing.   Cardiovascular: Negative for chest pain and palpitations.  Gastrointestinal: Positive for abdominal pain (right iliac crest pain). Negative for nausea and vomiting.  Genitourinary: Negative for dysuria and urgency.  Musculoskeletal: Positive for back pain. Negative for myalgias and arthralgias.  Skin: Negative for pallor and wound.  Neurological: Negative for dizziness and headaches.      Allergies  Latex  Home Medications   Prior to Admission medications   Medication Sig Start Date End Date Taking? Authorizing Provider  albuterol (PROVENTIL HFA;VENTOLIN HFA) 108 (90 BASE) MCG/ACT inhaler Inhale 2 puffs into the lungs every 4 (four) hours as needed for wheezing or shortness of breath. 09/17/14   Irean Hong, MD  Prenatal Vit-DSS-Fe Fum-FA (PRENATAL 19) tablet Take 1 tablet by mouth daily.    Historical Provider, MD   BP 125/74 mmHg  Pulse 88  Temp(Src) 98.1 F (36.7 C) (Oral)  Resp 22  Ht 5\' 5"  (1.651 m)  Wt 203 lb (92.08 kg)  BMI 33.78 kg/m2  SpO2 98% Physical Exam  Constitutional: She is oriented to person, place, and time.  She appears well-developed and well-nourished. No distress.  HENT:  Head: Normocephalic and atraumatic.  Eyes: EOM are normal. Pupils are equal, round, and reactive to light.  Neck: Normal range of motion. Neck supple.  Cardiovascular: Normal rate and regular rhythm.  Exam reveals no gallop and no friction rub.   No murmur heard. Pulmonary/Chest: Effort normal. She has no  wheezes. She has no rales.  Abdominal: Soft. She exhibits no distension. There is no tenderness.  Musculoskeletal: She exhibits tenderness (TTP worst about the muscle belly of the piriformis.  Mild diffuse TTP about the T to L spine). She exhibits no edema.  Neurological: She is alert and oriented to person, place, and time. She displays no Babinski's sign on the right side. She displays no Babinski's sign on the left side.  Reflex Scores:      Patellar reflexes are 2+ on the right side and 2+ on the left side.      Achilles reflexes are 2+ on the right side and 2+ on the left side. Sensation intact in all nerve distributions to bilateral lower extremities. Robin Myers able to ambulate without difficulty.  Skin: Skin is warm and dry. She is not diaphoretic.  Psychiatric: She has a normal mood and affect. Her behavior is normal.    ED Course  Procedures (including critical care time) Labs Review Labs Reviewed - No data to display  Imaging Review No results found.   EKG Interpretation None       EMERGENCY DEPARTMENT [place] PREGNANCY "Study: Limited Ultrasound of the Pelvis"  INDICATIONS:Pregnancy(required) and Pelvic pain Multiple views of the uterus and pelvic cavity are obtained with a multi-frequency probe.  APPROACH:Transabdominal   PERFORMED BY: Myself  IMAGES ARCHIVED?: Yes  LIMITATIONS: None   PREGNANCY UTERUS FINDINGS:Uterus enlarged ADNEXAL FINDINGS:No significant ovarian findings  PREGNANCY FINDINGS: Fetal heart activity seen  INTERPRETATION: Viable intrauterine pregnancy and Pelvic free fluid absent  GESTATIONAL AGE, ESTIMATE: 20wk 5 days  FETAL HEART RATE: 164      MDM   Final diagnoses:  Piriformis syndrome of right side    29 yo F with right-sided sciatica. Appears to be piriformis syndrome based on physical exam. Will trial stretching Tylenol OB follow-up. Bedside ultrasound with IUP estimated 20 weeks 5 days. Good fetal heart tones. Feel the pain  most likely low back in nature and not intra-abdominal. Given referral for wellness center will follow up with them as well as PCP.  3:01 PM:  I have discussed the diagnosis/risks/treatment options with the Robin Myers and family and believe the pt to be eligible for discharge home to follow-up with PCP/OB. We also discussed returning to the ED immediately if new or worsening sx occur. We discussed the sx which are most concerning (e.g., loss of bowel, bladder, perirectal sensation. Vaginal bleeding, worsening cramping) that necessitate immediate return. Medications administered to the Robin Myers during their visit and any new prescriptions provided to the Robin Myers are listed below.  Medications given during this visit Medications - No data to display  Discharge Medication List as of 12/13/2014  1:48 PM       The Robin Myers appears reasonably screen and/or stabilized for discharge and I doubt any other medical condition or other [facility] requiring further screening, evaluation, or treatment in the ED at this time prior to discharge.    Melene Plan, DO 12/13/14 1501

## 2014-12-13 NOTE — ED Notes (Signed)
Ultrasound done at bedside

## 2014-12-13 NOTE — ED Notes (Signed)
Pt is in stable condition upon d/c and ambulates from ED. 

## 2014-12-18 ENCOUNTER — Encounter: Payer: Self-pay | Admitting: *Deleted

## 2014-12-18 ENCOUNTER — Emergency Department
Admission: EM | Admit: 2014-12-18 | Discharge: 2014-12-19 | Disposition: A | Discharge status: Home / Self Care | Payer: Medicaid Other | Attending: Emergency Medicine | Admitting: Emergency Medicine

## 2014-12-18 DIAGNOSIS — Z91040 Latex allergy status: Secondary | ICD-10-CM | POA: Insufficient documentation

## 2014-12-18 DIAGNOSIS — F329 Major depressive disorder, single episode, unspecified: Secondary | ICD-10-CM | POA: Insufficient documentation

## 2014-12-18 DIAGNOSIS — Z87891 Personal history of nicotine dependence: Secondary | ICD-10-CM | POA: Insufficient documentation

## 2014-12-18 DIAGNOSIS — O99341 Other mental disorders complicating pregnancy, first trimester: Secondary | ICD-10-CM | POA: Diagnosis present

## 2014-12-18 DIAGNOSIS — Z3A20 20 weeks gestation of pregnancy: Secondary | ICD-10-CM | POA: Insufficient documentation

## 2014-12-18 DIAGNOSIS — F4310 Post-traumatic stress disorder, unspecified: Secondary | ICD-10-CM

## 2014-12-18 DIAGNOSIS — F32A Depression, unspecified: Secondary | ICD-10-CM

## 2014-12-18 DIAGNOSIS — Z3490 Encounter for supervision of normal pregnancy, unspecified, unspecified trimester: Secondary | ICD-10-CM

## 2014-12-18 DIAGNOSIS — Z79899 Other long term (current) drug therapy: Secondary | ICD-10-CM | POA: Insufficient documentation

## 2014-12-18 DIAGNOSIS — R45851 Suicidal ideations: Secondary | ICD-10-CM

## 2014-12-18 DIAGNOSIS — O1490 Unspecified pre-eclampsia, unspecified trimester: Secondary | ICD-10-CM | POA: Insufficient documentation

## 2014-12-18 DIAGNOSIS — F339 Major depressive disorder, recurrent, unspecified: Secondary | ICD-10-CM

## 2014-12-18 HISTORY — DX: Sciatica, unspecified side: M54.30

## 2014-12-18 HISTORY — DX: Unspecified pre-eclampsia, unspecified trimester: O14.90

## 2014-12-18 HISTORY — DX: Cardiac murmur, unspecified: R01.1

## 2014-12-18 LAB — CBC
HEMATOCRIT: 35.1 % (ref 35.0–47.0)
Hemoglobin: 11.2 g/dL — ABNORMAL LOW (ref 12.0–16.0)
MCH: 25.5 pg — ABNORMAL LOW (ref 26.0–34.0)
MCHC: 32 g/dL (ref 32.0–36.0)
MCV: 79.7 fL — ABNORMAL LOW (ref 80.0–100.0)
Platelets: 306 10*3/uL (ref 150–440)
RBC: 4.41 MIL/uL (ref 3.80–5.20)
RDW: 15.2 % — ABNORMAL HIGH (ref 11.5–14.5)
WBC: 11.9 10*3/uL — AB (ref 3.6–11.0)

## 2014-12-18 LAB — ETHANOL: Alcohol, Ethyl (B): <5 mg/dL (ref <5)

## 2014-12-18 LAB — ACETAMINOPHEN LEVEL: Acetaminophen (Tylenol), Serum: <10 ug/mL — ABNORMAL LOW (ref 10–30)

## 2014-12-18 LAB — COMPREHENSIVE METABOLIC PANEL
ALT: 12 U/L — AB (ref 14–54)
AST: 15 U/L (ref 15–41)
Albumin: 3.3 g/dL — ABNORMAL LOW (ref 3.5–5.0)
Alkaline Phosphatase: 104 U/L (ref 38–126)
Anion gap: 9 (ref 5–15)
BUN: 15 mg/dL (ref 6–20)
CHLORIDE: 104 mmol/L (ref 101–111)
CO2: 22 mmol/L (ref 22–32)
CREATININE: 0.73 mg/dL (ref 0.44–1.00)
Calcium: 9 mg/dL (ref 8.9–10.3)
GFR calc non Af Amer: >60 mL/min (ref >60)
Glucose, Bld: 118 mg/dL — ABNORMAL HIGH (ref 65–99)
Potassium: 3.9 mmol/L (ref 3.5–5.1)
Sodium: 135 mmol/L (ref 135–145)
Total Bilirubin: 0.1 mg/dL — ABNORMAL LOW (ref 0.3–1.2)
Total Protein: 7.5 g/dL (ref 6.5–8.1)

## 2014-12-18 LAB — SALICYLATE LEVEL

## 2014-12-18 MED ORDER — PRENATAL MULTIVITAMIN CH
1.0000 | ORAL_TABLET | Freq: Every day | ORAL | Status: DC
  Administered 2014-12-18: 1 via ORAL
  Filled 2014-12-18 (×2): qty 1

## 2014-12-18 NOTE — ED Notes (Signed)
BEHAVIORAL HEALTH ROUNDING Patient sleeping: No. Patient alert and oriented: yes Behavior appropriate: Yes.  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 

## 2014-12-18 NOTE — ED Notes (Addendum)
Lab called to add on the UA and check on UDS collected at 1808. Pt reports that she did give urine sample.

## 2014-12-18 NOTE — ED Notes (Signed)

## 2014-12-18 NOTE — ED Notes (Signed)
Pt was at her therapist today, sent to [facility] for further evaluation, pt then sent here.  Per IVC papers, the pt has had suicidal ideation, refusing to discuss intent on plan. In triage the pt denies SI, states that she has had depression, not on any meds.

## 2014-12-18 NOTE — ED Provider Notes (Signed)
[facility] Emergency Department Provider Note  ____________________________________________  Time seen: Approximately 630 PM  I have reviewed the triage vital signs and the nursing notes.   HISTORY  Chief Complaint Suicidal    HPI Robin Myers is a 29 y.o. female  with a history of depression and sciatica who is 5 months pregnant who is presenting with suicidal ideation from [facility]. Per the patient, she is denying any suicidal or homicidal ideation. She is denying any auditory or visual hallucinations. She denies any pain, urinary frequency or burning or vaginal bleeding or discharge. She says that she does have prenatal care. She says that she is high risk because of preeclampsia during a previous pregnancy.Says she is feeling the baby move.   Past Medical History  Diagnosis Date  . Sciatica   . Heart murmur   . Pre-eclampsia   . Preeclampsia   . Preeclampsia   . Preeclampsia     Patient Active Problem List   Diagnosis Date Noted  . Preeclampsia     Past Surgical History  Procedure Laterality Date  . Hernia repair      Current Outpatient Rx  Name  Route  Sig  Dispense  Refill  . acetaminophen (TYLENOL) 325 MG tablet   Oral   Take 650 mg by mouth as needed.         Marland Kitchen albuterol (PROVENTIL HFA;VENTOLIN HFA) 108 (90 BASE) MCG/ACT inhaler   Inhalation   Inhale 2 puffs into the lungs every 4 (four) hours as needed for wheezing or shortness of breath.   1 Inhaler   2   . Prenatal Vit-DSS-Fe Fum-FA (PRENATAL 19) tablet   Oral   Take 1 tablet by mouth daily.           Allergies Latex  No family history on file.  Social History History  Substance Use Topics  . Smoking status: Former Games developer  . Smokeless tobacco: Not on file  . Alcohol Use: No    Review of Systems Constitutional: No fever/chills Eyes: No visual changes. ENT: No sore throat. Cardiovascular: Denies chest pain. Respiratory: Denies shortness of  breath. Gastrointestinal: No abdominal pain.  No nausea, no vomiting.  No diarrhea.  No constipation. Genitourinary: Negative for dysuria. Musculoskeletal: Negative for back pain. Skin: Negative for rash. Neurological: Negative for headaches, focal weakness or numbness.  10-point ROS otherwise negative.  ____________________________________________   PHYSICAL EXAM:  VITAL SIGNS: ED Triage Vitals  Enc Vitals Group     BP 12/18/14 1750 141/92 mmHg     Pulse Rate 12/18/14 1750 112     Resp 12/18/14 1750 18     Temp 12/18/14 1750 98.2 F (36.8 C)     Temp Source 12/18/14 1750 Oral     SpO2 12/18/14 1750 99 %     Weight 12/18/14 1750 203 lb (92.08 kg)     Height --      Head Cir --      Peak Flow --      Pain Score 12/18/14 1750 8     Pain Loc --      Pain Edu? --      Excl. in GC? --     Constitutional: Alert and oriented. Well appearing and in no acute distress. Eyes: Conjunctivae are normal. PERRL. EOMI. Head: Atraumatic. Nose: No congestion/rhinnorhea. Mouth/Throat: Mucous membranes are moist.  Oropharynx non-erythematous. Neck: No stridor.   Cardiovascular: Normal rate, regular rhythm. Grossly normal heart sounds.  Good peripheral circulation. Respiratory: Normal  respiratory effort.  No retractions. Lungs CTAB. Gastrointestinal: Soft and nogravid uterus at the level of the umbilicus.  No abdominal bruits. No CVA tenderness. Musculoskeletal: No lower extremity tenderness nor edema.  No joint effusions. Neurologic:  Normal speech and language. No gross focal neurologic deficits are appreciated. No gait instability. Skin:  Skin is warm, dry and intact. No rash noted. Psychiatric: Mood and affect are normal. Speech and behavior are normal.  ____________________________________________   LABS (all labs ordered are listed, but only abnormal results are displayed)  Labs Reviewed  COMPREHENSIVE METABOLIC PANEL - Abnormal; Notable for the following:    Glucose, Bld 118  (*)    Albumin 3.3 (*)    ALT 12 (*)    Total Bilirubin 0.1 (*)    All other components within normal limits  ACETAMINOPHEN LEVEL - Abnormal; Notable for the following:    Acetaminophen (Tylenol), Serum <10 (*)    All other components within normal limits  CBC - Abnormal; Notable for the following:    WBC 11.9 (*)    Hemoglobin 11.2 (*)    MCV 79.7 (*)    MCH 25.5 (*)    RDW 15.2 (*)    All other components within normal limits  ETHANOL  SALICYLATE LEVEL  URINE DRUG SCREEN, QUALITATIVE ([facility] ONLY)   ____________________________________________  EKG   ____________________________________________  RADIOLOGY   ____________________________________________   PROCEDURES    ____________________________________________   INITIAL IMPRESSION / ASSESSMENT AND PLAN / ED COURSE  Pertinent labs & imaging results that were available during my care of the patient were reviewed by me and considered in my medical decision making (see chart for details).  Patient sent in from [facility] with IVC paperwork. We'll uphold paperwork and have the patient be seen by psychiatry here in the emergency department. Prenatal vitamins ordered. We'll recheck blood pressure 1 patient is more calm. Initial blood pressure slightly elevated.   ----------------------------------------- 10:16 PM on 12/18/2014 -----------------------------------------  Blood pressure is down trending. No lab results consistent with preeclampsia or HELLP.   ____________________________________________   FINAL CLINICAL IMPRESSION(S) / ED DIAGNOSES  Acute suicidal ideation per involuntary commitment paperwork. Initial visit.    Myrna Blazer, MD 12/18/14 2216
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
## 2014-12-19 DIAGNOSIS — F4310 Post-traumatic stress disorder, unspecified: Secondary | ICD-10-CM

## 2014-12-19 DIAGNOSIS — F331 Major depressive disorder, recurrent, moderate: Secondary | ICD-10-CM

## 2014-12-19 DIAGNOSIS — F339 Major depressive disorder, recurrent, unspecified: Secondary | ICD-10-CM

## 2014-12-19 LAB — URINALYSIS COMPLETE WITH MICROSCOPIC (ARMC ONLY)
Bilirubin Urine: NEGATIVE
Glucose, UA: NEGATIVE mg/dL
Hgb urine dipstick: NEGATIVE
Ketones, ur: NEGATIVE mg/dL
Nitrite: NEGATIVE
PH: 6 (ref 5.0–8.0)
Protein, ur: NEGATIVE mg/dL
SPECIFIC GRAVITY, URINE: 1.02 (ref 1.005–1.030)

## 2014-12-19 LAB — URINE DRUG SCREEN, QUALITATIVE (ARMC ONLY)
Amphetamines, Ur Screen: NOT DETECTED
Barbiturates, Ur Screen: NOT DETECTED
Benzodiazepine, Ur Scrn: NOT DETECTED
CANNABINOID 50 NG, UR ~~LOC~~: NOT DETECTED
Cocaine Metabolite,Ur ~~LOC~~: NOT DETECTED
MDMA (ECSTASY) UR SCREEN: NOT DETECTED
Methadone Scn, Ur: NOT DETECTED
Opiate, Ur Screen: NOT DETECTED
Phencyclidine (PCP) Ur S: NOT DETECTED
Tricyclic, Ur Screen: NOT DETECTED

## 2014-12-19 NOTE — ED Notes (Signed)

## 2014-12-19 NOTE — ED Notes (Signed)
Confirmed with Verne Carrow, NT, that urine was sent for pt at [facility]

## 2014-12-19 NOTE — ED Notes (Addendum)
Patient assigned to appropriate care area. Patient oriented to unit/care area: Informed that, for their safety, care areas are designed for safety and monitored by security cameras at all times; and visiting hours explained to patient. Patient verbalizes understanding, and verbal contract for safety obtained. 

## 2014-12-19 NOTE — ED Notes (Signed)
BEHAVIORAL HEALTH ROUNDING Patient sleeping: Yes.   Patient alert and oriented: not applicable Behavior appropriate: Yes.  ; If no, describe:   Nutrition and fluids offered: No Toileting and hygiene offered: No Sitter present: no Law enforcement present: Yes  and ODS    

## 2014-12-19 NOTE — ED Notes (Signed)
BEHAVIORAL HEALTH ROUNDING Patient sleeping: Yes.   Patient alert and oriented: eyes closed  Appears asleep Behavior appropriate: Yes.  ; If no, describe:  Nutrition and fluids offered: Yes  Toileting and hygiene offered: sleeping Sitter present: q 15 minute observations and security camera monitoring Law enforcement present: yes  ODS 

## 2014-12-19 NOTE — ED Notes (Signed)
Breakfast provided along with an extra drink    Appropriate to stimulation  No verbalized needs or concerns at this time  NAD assessed  Continue to monitor 

## 2014-12-19 NOTE — ED Provider Notes (Signed)
-----------------------------------------   8:08 AM on 12/19/2014 -----------------------------------------   Blood pressure 115/83, pulse 74, temperature 98.6 F (37 C), temperature source Oral, resp. rate 18, weight 203 lb (92.08 kg), SpO2 100 %.  The patient had no acute events since last update.  Calm and cooperative at this time.  Disposition is pending per Psychiatry/Behavioral Medicine team recommendations.   Patient seen and discharge and evaluated by psychiatry. Patient will be discharged  Arnaldo Natal, MD 12/19/14 1217

## 2014-12-19 NOTE — ED Notes (Signed)
Patient observed lying in bed with eyes closed  Even, unlabored respirations observed   NAD pt appears to be sleeping  I will continue to monitor along with every 15 minute visual observations and ongoing security camera monitoring    

## 2014-12-19 NOTE — ED Notes (Signed)
BEHAVIORAL HEALTH ROUNDING Patient sleeping: No. Patient alert and oriented: yes Behavior appropriate: Yes.  ; If no, describe:  Nutrition and fluids offered: Yes  Toileting and hygiene offered: Yes  Sitter present: no Law enforcement present: Yes  and ODS  ED BHU PLACEMENT JUSTIFICATION Is the patient under IVC or is there intent for IVC: Yes.   Is the patient medically cleared: Yes.   Is there vacancy in the ED BHU: Yes.   Is the population mix appropriate for patient: Yes.   Is the patient awaiting placement in inpatient or outpatient setting: No.   Has the patient had a psychiatric consult: Yes.   Survey of unit performed for contraband, proper placement and condition of furniture, tampering with fixtures in bathroom, shower, and each patient room: Yes.  ; Findings: all clear APPEARANCE/BEHAVIOR calm and cooperative NEURO ASSESSMENT Orientation: time, place and person Hallucinations: No.None noted (Hallucinations) Speech:  normal Gait: normal RESPIRATORY ASSESSMENT Normal expansion.  Clear to auscultation.  No rales, rhonchi, or wheezing. CARDIOVASCULAR ASSESSMENT regular rate and rhythm, S1, S2 normal, no murmur, click, rub or gallop GASTROINTESTINAL ASSESSMENT soft, nontender, BS WNL, no r/g EXTREMITIES normal strength, tone, and muscle mass PLAN OF CARE Provide calm/safe environment. Vital signs assessed twice daily. ED BHU Assessment once each 12-hour shift. Collaborate with intake RN daily or as condition indicates. Assure the ED provider has rounded once each shift. Provide and encourage hygiene. Provide redirection as needed. Assess for escalating behavior; address immediately and inform ED provider.  Assess family dynamic and appropriateness for visitation as needed: No.; If necessary, describe findings:   Educate the patient/family about BHU procedures/visitation: Yes.  ; If necessary, describe findings:    ENVIRONMENTAL ASSESSMENT Potentially harmful objects out of  patient reach: Yes.   Personal belongings secured: Yes.   Patient dressed in hospital provided attire only: Yes.   Plastic bags out of patient reach: Yes.   Patient care equipment (cords, cables, call bells, lines, and drains) shortened, removed, or accounted for: Yes.   Equipment and supplies removed from bottom of stretcher: Yes.   Potentially toxic materials out of patient reach: Yes.   Sharps container removed or out of patient reach: Yes.   

## 2014-12-19 NOTE — ED Notes (Signed)
She has been up to the BR  - urine sample provided  Labeled and taken to the lab  Remote returned to the window

## 2014-12-19 NOTE — ED Notes (Signed)
BEHAVIORAL HEALTH ROUNDING Patient sleeping: No. Patient alert and oriented: yes Behavior appropriate: Yes.  ; If no, describe:  Nutrition and fluids offered: yes Toileting and hygiene offered: Yes  Sitter present: q15 minute observations and security camera monitoring Law enforcement present: Yes  ODS  

## 2014-12-19 NOTE — ED Notes (Signed)
Discharge instructions reviewed with her and she verbalized agreement and understanding  1/1 bags of belongings returned to her and she verbalized that she received back all belongings that she came here with

## 2014-12-19 NOTE — ED Notes (Signed)
Pt to the door of her room  - "When will the doctor come?"  i introduced myself to her - told her phone hours and informed her that the psychiatrist consults in this area after lunch - usually after 1300 - pt acted upset but agreed  Remote provided

## 2014-12-19 NOTE — ED Notes (Signed)

## 2014-12-19 NOTE — ED Notes (Signed)
Lab, Joe, called back to verify urine for pt is not in lab
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
## 2014-12-19 NOTE — ED Notes (Signed)
md Clapacs has consulted with her and she will be discharged to home

## 2014-12-19 NOTE — ED Notes (Signed)
Breakfast provided  - pt awakened

## 2015-01-02 ENCOUNTER — Inpatient Hospital Stay
Admission: EM | Admit: 2015-01-02 | Discharge: 2015-01-02 | Disposition: A | Discharge status: Home / Self Care | Payer: Medicaid Other | Attending: Obstetrics & Gynecology | Admitting: Obstetrics & Gynecology

## 2015-01-02 NOTE — Discharge Summary (Signed)
  Observation/ Triage Status Home stable, Not delivered.

## 2015-01-19 LAB — OB RESULTS CONSOLE RPR: RPR: NONREACTIVE

## 2015-01-26 LAB — OB RESULTS CONSOLE GC/CHLAMYDIA
Chlamydia: NEGATIVE
GC PROBE AMP, GENITAL: NEGATIVE

## 2015-02-08 ENCOUNTER — Observation Stay
Admission: EM | Admit: 2015-02-08 | Discharge: 2015-02-08 | Disposition: A | Discharge status: Home / Self Care | Payer: Medicaid Other | Attending: Obstetrics & Gynecology | Admitting: Obstetrics & Gynecology

## 2015-02-08 ENCOUNTER — Encounter: Payer: Self-pay | Admitting: *Deleted

## 2015-02-08 DIAGNOSIS — Z3A29 29 weeks gestation of pregnancy: Secondary | ICD-10-CM | POA: Insufficient documentation

## 2015-02-08 DIAGNOSIS — O368130 Decreased fetal movements, third trimester, not applicable or unspecified: Principal | ICD-10-CM | POA: Insufficient documentation

## 2015-02-16 ENCOUNTER — Observation Stay
Admission: EM | Admit: 2015-02-16 | Discharge: 2015-02-17 | Disposition: A | Discharge status: Home / Self Care | Payer: Medicaid Other | Attending: Obstetrics and Gynecology | Admitting: Obstetrics and Gynecology

## 2015-02-16 DIAGNOSIS — O26893 Other specified pregnancy related conditions, third trimester: Principal | ICD-10-CM | POA: Insufficient documentation

## 2015-02-16 DIAGNOSIS — Z3A30 30 weeks gestation of pregnancy: Secondary | ICD-10-CM | POA: Insufficient documentation

## 2015-02-16 DIAGNOSIS — R51 Headache: Secondary | ICD-10-CM | POA: Diagnosis present

## 2015-02-16 DIAGNOSIS — R519 Headache, unspecified: Secondary | ICD-10-CM | POA: Diagnosis present

## 2015-02-16 DIAGNOSIS — O1200 Gestational edema, unspecified trimester: Secondary | ICD-10-CM

## 2015-02-16 DIAGNOSIS — R42 Dizziness and giddiness: Secondary | ICD-10-CM | POA: Diagnosis not present

## 2015-02-16 MED ORDER — ACETAMINOPHEN 325 MG PO TABS
650.0000 mg | ORAL_TABLET | ORAL | Status: DC | PRN
  Administered 2015-02-17: 650 mg via ORAL
  Filled 2015-02-16: qty 2

## 2015-02-16 NOTE — OB Triage Note (Signed)
Patient c/o of blurry vision and headache and heartburn "that is 1000."  Patient seen at [facility] on 9/26 and in clinic today 9/30.  Patient sent to L&D for evaluation of preeclampsia by Dr. Vergie Living.  Patient states her husband could not get off work until now.

## 2015-02-17 ENCOUNTER — Observation Stay: Payer: Medicaid Other

## 2015-02-17 DIAGNOSIS — O26893 Other specified pregnancy related conditions, third trimester: Secondary | ICD-10-CM | POA: Diagnosis not present

## 2015-02-17 LAB — PROTEIN / CREATININE RATIO, URINE
Creatinine, Urine: 172 mg/dL
Protein Creatinine Ratio: 0.08 mg/mg{Cre} (ref 0.00–0.15)
TOTAL PROTEIN, URINE: 14 mg/dL

## 2015-02-17 LAB — CBC
HEMATOCRIT: 31.1 % — AB (ref 35.0–47.0)
HEMOGLOBIN: 10.2 g/dL — AB (ref 12.0–16.0)
MCH: 26.4 pg (ref 26.0–34.0)
MCHC: 32.7 g/dL (ref 32.0–36.0)
MCV: 80.7 fL (ref 80.0–100.0)
Platelets: 247 10*3/uL (ref 150–440)
RBC: 3.85 MIL/uL (ref 3.80–5.20)
RDW: 15.7 % — ABNORMAL HIGH (ref 11.5–14.5)
WBC: 11.8 10*3/uL — ABNORMAL HIGH (ref 3.6–11.0)

## 2015-02-17 LAB — COMPREHENSIVE METABOLIC PANEL
ALBUMIN: 2.6 g/dL — AB (ref 3.5–5.0)
ALT: 12 U/L — ABNORMAL LOW (ref 14–54)
ANION GAP: 3 — AB (ref 5–15)
AST: 17 U/L (ref 15–41)
Alkaline Phosphatase: 107 U/L (ref 38–126)
BILIRUBIN TOTAL: 0.4 mg/dL (ref 0.3–1.2)
BUN: 13 mg/dL (ref 6–20)
CHLORIDE: 108 mmol/L (ref 101–111)
CO2: 25 mmol/L (ref 22–32)
Calcium: 8.4 mg/dL — ABNORMAL LOW (ref 8.9–10.3)
Creatinine, Ser: 0.86 mg/dL (ref 0.44–1.00)
GFR calc Af Amer: >60 mL/min (ref >60)
GLUCOSE: 96 mg/dL (ref 65–99)
POTASSIUM: 3.9 mmol/L (ref 3.5–5.1)
Sodium: 136 mmol/L (ref 135–145)
TOTAL PROTEIN: 5.9 g/dL — AB (ref 6.5–8.1)

## 2015-02-17 LAB — URINE DRUG SCREEN, QUALITATIVE (ARMC ONLY)
AMPHETAMINES, UR SCREEN: NOT DETECTED
BENZODIAZEPINE, UR SCRN: NOT DETECTED
Barbiturates, Ur Screen: POSITIVE — AB
Cannabinoid 50 Ng, Ur ~~LOC~~: NOT DETECTED
Cocaine Metabolite,Ur ~~LOC~~: NOT DETECTED
MDMA (ECSTASY) UR SCREEN: NOT DETECTED
Methadone Scn, Ur: NOT DETECTED
Opiate, Ur Screen: NOT DETECTED
Phencyclidine (PCP) Ur S: NOT DETECTED
Tricyclic, Ur Screen: NOT DETECTED

## 2015-02-17 MED ORDER — ALUM & MAG HYDROXIDE-SIMETH 200-200-20 MG/5ML PO SUSP
ORAL | Status: AC
  Administered 2015-02-17: 04:00:00
  Filled 2015-02-17: qty 30

## 2015-02-17 NOTE — H&P (Signed)
Obstetric H&P   Chief Complaint: Robin Myers work up  Prenatal Care Provider: [facility]  History of Present Illness: 29 y.o. G2P1001 90w2dby 04/26/2015 presenting for preeclampsia work up.  BP in clinic was 124/80 with negative protein and glucose on UA.  Headache has eben ongoing for past few days was seen and [facility] from [facility]/25 with headache and blurry vision discharged home at that time.  She did have a 7lbs weight gain in the last two days per clinic records.   Review of Systems: 10 point review of systems negative unless otherwise noted in HPI  Past Medical History: Past Medical History  Diagnosis Date  . Sciatica   . Heart murmur   . Pre-eclampsia   . Preeclampsia   . Preeclampsia   . Preeclampsia     Past Surgical History: Past Surgical History  Procedure Laterality Date  . Hernia repair      Past Obstetric History:  Past Gynecologic History:  Family History: No family history on file.  Social History: Social History   Social History  . Marital Status: Single    Spouse Name: N/A  . Number of Children: N/A  . Years of Education: N/A   Occupational History  . Not on file.   Social History Main Topics  . Smoking status: Light Tobacco Smoker  . Smokeless tobacco: Never Used  . Alcohol Use: No  . Drug Use: No  . Sexual Activity: Yes    Birth Control/ Protection: None   Other Topics Concern  . Not on file   Social History Narrative    Medications: Prior to Admission medications   Medication Sig Start Date End Date Taking? Authorizing Provider  acetaminophen (TYLENOL) 325 MG tablet Take 650 mg by mouth as needed.   Yes Historical Provider, MD  albuterol (PROVENTIL HFA;VENTOLIN HFA) 108 (90 BASE) MCG/ACT inhaler Inhale 2 puffs into the lungs every 4 (four) hours as needed for wheezing or shortness of breath. 09/17/14  Yes JPaulette Blanch MD  Prenatal Vit-DSS-Fe Fum-FA (PRENATAL 19) tablet Take 1 tablet by mouth daily.   Yes Historical Provider, MD     Allergies: Allergies  Allergen Reactions  . Latex     Physical Exam: Vitals: Blood pressure 95/51, pulse 95, temperature 98.5 F (36.9 C), temperature source Oral, resp. rate 16.  FHT: 130, moderate, +accels, no decels Toco: absent  General: NAD HEENT: normocephalic, anicteric Pulmonary: no increased work of breathing Abdomen: Gravid,  Non-tender Leopolds: vtx Extremities: no edema, erythema    Labs: Results for orders placed or performed during the hospital encounter of 02/16/15 (from the past 72 hour(s))  Comprehensive metabolic panel     Status: Abnormal   Collection Time: 02/16/15 11:44 PM  Result Value Ref Range   Sodium 136 135 - 145 mmol/L   Potassium 3.9 3.5 - 5.1 mmol/L   Chloride 108 101 - 111 mmol/L   CO2 25 22 - 32 mmol/L   Glucose, Bld 96 65 - 99 mg/dL   BUN 13 6 - 20 mg/dL   Creatinine, Ser 0.86 0.44 - 1.00 mg/dL   Calcium 8.4 (L) 8.9 - 10.3 mg/dL   Total Protein 5.9 (L) 6.5 - 8.1 g/dL   Albumin 2.6 (L) 3.5 - 5.0 g/dL   AST 17 15 - 41 U/L   ALT 12 (L) 14 - 54 U/L   Alkaline Phosphatase 107 38 - 126 U/L   Total Bilirubin 0.4 0.3 - 1.2 mg/dL   GFR calc non Af Amer >  60 >60 mL/min   GFR calc Af Amer >60 >60 mL/min    Comment: (NOTE) The eGFR has been calculated using the CKD EPI equation. This calculation has not been validated in all clinical situations. eGFR's persistently <60 mL/min signify possible Chronic Kidney Disease.    Anion gap 3 (L) 5 - 15  CBC     Status: Abnormal   Collection Time: 02/16/15 11:44 PM  Result Value Ref Range   WBC 11.8 (H) 3.6 - 11.0 K/uL   RBC 3.85 3.80 - 5.20 MIL/uL   Hemoglobin 10.2 (L) 12.0 - 16.0 g/dL   HCT 31.1 (L) 35.0 - 47.0 %   MCV 80.7 80.0 - 100.0 fL   MCH 26.4 26.0 - 34.0 pg   MCHC 32.7 32.0 - 36.0 g/dL   RDW 15.7 (H) 11.5 - 14.5 %   Platelets 247 150 - 440 K/uL  Protein / creatinine ratio, urine     Status: None   Collection Time: 02/16/15 11:45 PM  Result Value Ref Range   Creatinine, Urine 172  mg/dL   Total Protein, Urine 14 mg/dL    Comment: NO NORMAL RANGE ESTABLISHED FOR THIS TEST   Protein Creatinine Ratio 0.08 0.00 - 0.15 mg/mg[Cre]  Urine Drug Screen, Qualitative ([facility] only)     Status: Abnormal   Collection Time: 02/16/15 11:45 PM  Result Value Ref Range   Tricyclic, Ur Screen NONE DETECTED NONE DETECTED   Amphetamines, Ur Screen NONE DETECTED NONE DETECTED   MDMA (Ecstasy)Ur Screen NONE DETECTED NONE DETECTED   Cocaine Metabolite,Ur [place] NONE DETECTED NONE DETECTED   Opiate, Ur Screen NONE DETECTED NONE DETECTED   Phencyclidine (PCP) Ur S NONE DETECTED NONE DETECTED   Cannabinoid 50 Ng, Ur [place] NONE DETECTED NONE DETECTED   Barbiturates, Ur Screen POSITIVE (A) NONE DETECTED   Benzodiazepine, Ur Scrn NONE DETECTED NONE DETECTED   Methadone Scn, Ur NONE DETECTED NONE DETECTED    Comment: (NOTE) 973  Tricyclics, urine               Cutoff 1000 ng/mL 200  Amphetamines, urine             Cutoff 1000 ng/mL 300  MDMA (Ecstasy), urine           Cutoff 500 ng/mL 400  Cocaine Metabolite, urine       Cutoff 300 ng/mL 500  Opiate, urine                   Cutoff 300 ng/mL 600  Phencyclidine (PCP), urine      Cutoff 25 ng/mL 700  Cannabinoid, urine              Cutoff 50 ng/mL 800  Barbiturates, urine             Cutoff 200 ng/mL 900  Benzodiazepine, urine           Cutoff 200 ng/mL 1000 Methadone, urine                Cutoff 300 ng/mL 1100 1200 The urine drug screen provides only a preliminary, unconfirmed 1300 analytical test result and should not be used for non-medical 1400 purposes. Clinical consideration and professional judgment should 1500 be applied to any positive drug screen result due to possible 1600 interfering substances. A more specific alternate chemical method 1700 must be used in order to obtain a confirmed analytical result.  1800 Gas chromato graphy / mass spectrometry (GC/MS) is the preferred 1900 confirmatory method.  Imaging US Venous Img Lower  Bilateral  02/17/2015   CLINICAL DATA:  Bilateral leg swelling and pregnancy  EXAM: BILATERAL LOWER EXTREMITY VENOUS DOPPLER ULTRASOUND  TECHNIQUE: Gray-scale sonography with graded compression, as well as color Doppler and duplex ultrasound were performed to evaluate the lower extremity deep venous systems from the level of the common femoral vein and including the common femoral, femoral, profunda femoral, popliteal and calf veins including the posterior tibial, peroneal and gastrocnemius veins when visible. The superficial great saphenous vein was also interrogated. Spectral Doppler was utilized to evaluate flow at rest and with distal augmentation maneuvers in the common femoral, femoral and popliteal veins.  COMPARISON:  None.  FINDINGS: RIGHT LOWER EXTREMITY  Common Femoral Vein: No evidence of thrombus. Normal compressibility, respiratory phasicity and response to augmentation.  Saphenofemoral Junction: No evidence of thrombus.  Profunda Femoral Vein: No evidence of thrombus.  Femoral Vein: No evidence of thrombus.  Popliteal Vein: No evidence of thrombus.  Calf Veins: No evidence of thrombus.  LEFT LOWER EXTREMITY  Common Femoral Vein: No evidence of thrombus. Normal compressibility, respiratory phasicity and response to augmentation.  Saphenofemoral Junction: No evidence of thrombus.  Profunda Femoral Vein: No evidence of thrombus.  Femoral Vein: No evidence of thrombus.  Popliteal Vein: No evidence of thrombus.  Calf Veins: No evidence of thrombus.  IMPRESSION: Both lower extremities are negative for deep venous thrombosis.   Electronically Signed   By: Monte Fantasia M.D.   On: 02/17/2015 04:30    Assessment: 29 y.o. G2P1001 47w2dby 04/26/2015 for preeclampsia work up  Plan: 1) PIH - normotensive - will obtain PIH panel per Dr. PThomes Dinningrequest given neurologic symptoms (negative)  2) Fetus - cat I tracing  3) Lower extremity swelling - negative bilateral lower extremity dopplers  4)  Disposition - home follow up in 1 week

## 2015-02-26 ENCOUNTER — Observation Stay
Admission: EM | Admit: 2015-02-26 | Discharge: 2015-02-26 | Disposition: A | Discharge status: Home / Self Care | Payer: Medicaid Other | Attending: Obstetrics & Gynecology | Admitting: Obstetrics & Gynecology

## 2015-02-26 ENCOUNTER — Encounter: Payer: Self-pay | Admitting: *Deleted

## 2015-02-26 DIAGNOSIS — Z3A33 33 weeks gestation of pregnancy: Secondary | ICD-10-CM | POA: Diagnosis not present

## 2015-02-26 DIAGNOSIS — R109 Unspecified abdominal pain: Secondary | ICD-10-CM | POA: Insufficient documentation

## 2015-02-26 DIAGNOSIS — R6889 Other general symptoms and signs: Secondary | ICD-10-CM | POA: Diagnosis present

## 2015-02-26 DIAGNOSIS — O26893 Other specified pregnancy related conditions, third trimester: Principal | ICD-10-CM | POA: Insufficient documentation

## 2015-02-26 LAB — CHLAMYDIA/NGC RT PCR (ARMC ONLY)
CHLAMYDIA TR: NOT DETECTED
N GONORRHOEAE: NOT DETECTED

## 2015-02-26 LAB — COMPREHENSIVE METABOLIC PANEL
ALK PHOS: 131 U/L — AB (ref 38–126)
ALT: 15 U/L (ref 14–54)
ANION GAP: 3 — AB (ref 5–15)
AST: 18 U/L (ref 15–41)
Albumin: 2.8 g/dL — ABNORMAL LOW (ref 3.5–5.0)
BUN: 9 mg/dL (ref 6–20)
CALCIUM: 8.4 mg/dL — AB (ref 8.9–10.3)
CO2: 26 mmol/L (ref 22–32)
CREATININE: 0.86 mg/dL (ref 0.44–1.00)
Chloride: 106 mmol/L (ref 101–111)
GFR calc non Af Amer: >60 mL/min (ref >60)
Glucose, Bld: 71 mg/dL (ref 65–99)
Potassium: 3.7 mmol/L (ref 3.5–5.1)
SODIUM: 135 mmol/L (ref 135–145)
TOTAL PROTEIN: 6.5 g/dL (ref 6.5–8.1)
Total Bilirubin: 0.4 mg/dL (ref 0.3–1.2)

## 2015-02-26 LAB — URINALYSIS COMPLETE WITH MICROSCOPIC (ARMC ONLY)
BILIRUBIN URINE: NEGATIVE
GLUCOSE, UA: NEGATIVE mg/dL
Hgb urine dipstick: NEGATIVE
Ketones, ur: NEGATIVE mg/dL
NITRITE: NEGATIVE
Protein, ur: 30 mg/dL — AB
SPECIFIC GRAVITY, URINE: 1.02 (ref 1.005–1.030)
pH: 6 (ref 5.0–8.0)

## 2015-02-26 LAB — CBC
HEMATOCRIT: 32.4 % — AB (ref 35.0–47.0)
HEMOGLOBIN: 10.7 g/dL — AB (ref 12.0–16.0)
MCH: 26.4 pg (ref 26.0–34.0)
MCHC: 33.1 g/dL (ref 32.0–36.0)
MCV: 79.7 fL — AB (ref 80.0–100.0)
Platelets: 263 10*3/uL (ref 150–440)
RBC: 4.06 MIL/uL (ref 3.80–5.20)
RDW: 15.2 % — ABNORMAL HIGH (ref 11.5–14.5)
WBC: 9.7 10*3/uL (ref 3.6–11.0)

## 2015-02-26 LAB — INFLUENZA PANEL BY PCR (TYPE A & B)
H1N1 flu by pcr: NOT DETECTED
INFLAPCR: NEGATIVE
INFLBPCR: NEGATIVE

## 2015-02-26 MED ORDER — CEPHALEXIN 500 MG PO CAPS
500.0000 mg | ORAL_CAPSULE | Freq: Once | ORAL | Status: AC
  Administered 2015-02-26: 500 mg via ORAL
  Filled 2015-02-26 (×2): qty 1

## 2015-02-26 MED ORDER — ACETAMINOPHEN 500 MG PO TABS
1000.0000 mg | ORAL_TABLET | Freq: Once | ORAL | Status: AC
  Administered 2015-02-26: 1000 mg via ORAL
  Filled 2015-02-26: qty 2

## 2015-02-26 NOTE — OB Triage Note (Signed)
Pt came from ED with c/o abdominal pain and flu like symptoms

## 2015-02-26 NOTE — Discharge Instructions (Signed)
Get plenty of rest, drink plenty of fluids. Call [facility] if abdominal pain gets worse.   Follow up with your provider on Tuesday, October 11th.

## 2015-02-28 LAB — URINE CULTURE

## 2015-03-30 ENCOUNTER — Observation Stay
Admission: EM | Admit: 2015-03-30 | Discharge: 2015-03-30 | Disposition: A | Discharge status: Home / Self Care | Payer: Medicaid Other | Attending: Obstetrics and Gynecology | Admitting: Obstetrics and Gynecology

## 2015-03-30 DIAGNOSIS — O368130 Decreased fetal movements, third trimester, not applicable or unspecified: Secondary | ICD-10-CM | POA: Diagnosis not present

## 2015-03-30 DIAGNOSIS — Z3A36 36 weeks gestation of pregnancy: Secondary | ICD-10-CM | POA: Diagnosis not present

## 2015-03-30 LAB — OB RESULTS CONSOLE GBS: GBS: NEGATIVE

## 2015-03-30 LAB — OB RESULTS CONSOLE GC/CHLAMYDIA
Chlamydia: NEGATIVE
Gonorrhea: NEGATIVE

## 2015-03-30 NOTE — OB Triage Note (Signed)
Recvd from ED per wheelchair to OBS2 with complaints of decreased fetal movement and variable noted on EFM in [facility].  Changed to gown and to bed.  EFM applied.  Consents signed, plan of care discussed and oriented to room.  Verbalized understanding and agreement with plan of care.

## 2015-03-30 NOTE — Discharge Planning (Signed)
Discharge instructions given and explained.  Verbalized understanding.  Signed copy to chart and one in hand.   

## 2015-03-30 NOTE — Final Progress Note (Signed)
Physician Final Progress Note  Patient ID: Robin Myers MRN: [id number] DOB/AGE: 28/07/1985 28 y.o.  Admit date: 03/30/2015 Admitting provider: Conard NovakStephen D Zikeria Keough, MD Discharge date: 03/30/2015   Admission Diagnoses: Decreased fetal movement, non-reactive NST in office due to a single, 30-second variable deceleration to 100 bpm for 30 seconds.  Otherwise, in office, variability was moderate, +accels, with baseline 140 bpm.   Discharge Diagnoses:  Decreased fetal movement Reactive NST  Comments: patient had decreased fetal movement and vaginal bleeding which occurred yesterday.  She has no vaginal bleeding nor abdominal pain today.  She does not feel the baby moving well even while the baby is audibly moving on the fetal monitor.  She denies contractions and LOF.    Consults: None  Significant Findings/ Diagnostic Studies: None  Procedures: Non-stress test.  Reactive (category 1), baseline 135 bpm, moderate variability, +accels, no decels.  Duration > 45 minutes.    Discharge Condition: stable  Disposition: 01-Home or Self Care  Diet: Regular diet  Discharge Activity: Activity as tolerated     Medication List    ASK your doctor about these medications        acetaminophen 325 MG tablet  Commonly known as:  TYLENOL  Take 650 mg by mouth as needed.     albuterol 108 (90 BASE) MCG/ACT inhaler  Commonly known as:  PROVENTIL HFA;VENTOLIN HFA  Inhale 2 puffs into the lungs every 4 (four) hours as needed for wheezing or shortness of breath.     PRENATAL 19 tablet  Take 1 tablet by mouth daily.       Follow up in one week for routine OB visit.  Strict precautions given for vaginal bleeding, no fetal movement, headaches, visual disturbances, and RUQ pain, or any other symptom of concern.  Patient encouraged to report these issues immediately and not wait until her next appointment. She voiced understanding and agreement with the plan.   Total time spent taking care of this  patient: 30 minutes  Signed: Conard NovakJackson, Simren Popson D, MD 03/30/2015, 12:02 PM

## 2015-04-10 ENCOUNTER — Inpatient Hospital Stay
Admission: AD | Admit: 2015-04-10 | Discharge: 2015-04-12 | DRG: 774 | Disposition: A | Discharge status: Home / Self Care | Payer: Medicaid Other | Source: Intra-hospital | Attending: Obstetrics and Gynecology | Admitting: Obstetrics and Gynecology

## 2015-04-10 DIAGNOSIS — D62 Acute posthemorrhagic anemia: Secondary | ICD-10-CM | POA: Diagnosis not present

## 2015-04-10 DIAGNOSIS — M5430 Sciatica, unspecified side: Secondary | ICD-10-CM | POA: Diagnosis present

## 2015-04-10 DIAGNOSIS — O9081 Anemia of the puerperium: Secondary | ICD-10-CM | POA: Diagnosis not present

## 2015-04-10 DIAGNOSIS — F4310 Post-traumatic stress disorder, unspecified: Secondary | ICD-10-CM | POA: Diagnosis present

## 2015-04-10 DIAGNOSIS — B009 Herpesviral infection, unspecified: Secondary | ICD-10-CM | POA: Diagnosis present

## 2015-04-10 DIAGNOSIS — Z3493 Encounter for supervision of normal pregnancy, unspecified, third trimester: Secondary | ICD-10-CM

## 2015-04-10 DIAGNOSIS — G43909 Migraine, unspecified, not intractable, without status migrainosus: Secondary | ICD-10-CM | POA: Diagnosis present

## 2015-04-10 DIAGNOSIS — O4292 Full-term premature rupture of membranes, unspecified as to length of time between rupture and onset of labor: Principal | ICD-10-CM | POA: Diagnosis present

## 2015-04-10 DIAGNOSIS — Z3A39 39 weeks gestation of pregnancy: Secondary | ICD-10-CM | POA: Diagnosis not present

## 2015-04-10 DIAGNOSIS — G8929 Other chronic pain: Secondary | ICD-10-CM | POA: Diagnosis present

## 2015-04-10 DIAGNOSIS — Z3A37 37 weeks gestation of pregnancy: Secondary | ICD-10-CM

## 2015-04-10 DIAGNOSIS — F329 Major depressive disorder, single episode, unspecified: Secondary | ICD-10-CM | POA: Diagnosis present

## 2015-04-10 DIAGNOSIS — O99340 Other mental disorders complicating pregnancy, unspecified trimester: Secondary | ICD-10-CM | POA: Diagnosis present

## 2015-04-10 DIAGNOSIS — O9852 Other viral diseases complicating childbirth: Secondary | ICD-10-CM | POA: Diagnosis present

## 2015-04-10 LAB — CBC
HEMATOCRIT: 33.8 % — AB (ref 35.0–47.0)
Hemoglobin: 10.9 g/dL — ABNORMAL LOW (ref 12.0–16.0)
MCH: 25.2 pg — ABNORMAL LOW (ref 26.0–34.0)
MCHC: 32.3 g/dL (ref 32.0–36.0)
MCV: 77.9 fL — AB (ref 80.0–100.0)
PLATELETS: 275 10*3/uL (ref 150–440)
RBC: 4.34 MIL/uL (ref 3.80–5.20)
RDW: 16.1 % — ABNORMAL HIGH (ref 11.5–14.5)
WBC: 10.2 10*3/uL (ref 3.6–11.0)

## 2015-04-10 LAB — TYPE AND SCREEN
ABO/RH(D): O POS
Antibody Screen: NEGATIVE

## 2015-04-10 LAB — ABO/RH: ABO/RH(D): O POS

## 2015-04-10 MED ORDER — WITCH HAZEL-GLYCERIN EX PADS: 1.0000 "application " | MEDICATED_PAD | CUTANEOUS | Status: DC | PRN

## 2015-04-10 MED ORDER — DIPHENHYDRAMINE HCL 25 MG PO CAPS: 25.0000 mg | ORAL_CAPSULE | Freq: Four times a day (QID) | ORAL | Status: DC | PRN

## 2015-04-10 MED ORDER — MISOPROSTOL 200 MCG PO TABS
ORAL_TABLET | ORAL | Status: AC
  Filled 2015-04-10: qty 4

## 2015-04-10 MED ORDER — ZOLPIDEM TARTRATE 5 MG PO TABS: 5.0000 mg | ORAL_TABLET | Freq: Every evening | ORAL | Status: DC | PRN

## 2015-04-10 MED ORDER — ACETAMINOPHEN 325 MG PO TABS
650.0000 mg | ORAL_TABLET | ORAL | Status: DC | PRN
  Administered 2015-04-10: 650 mg via ORAL
  Filled 2015-04-10: qty 2

## 2015-04-10 MED ORDER — LANOLIN HYDROUS EX OINT: TOPICAL_OINTMENT | CUTANEOUS | Status: DC | PRN

## 2015-04-10 MED ORDER — LACTATED RINGERS IV SOLN: 500.0000 mL | INTRAVENOUS | Status: DC | PRN

## 2015-04-10 MED ORDER — LIDOCAINE HCL (PF) 1 % IJ SOLN
INTRAMUSCULAR | Status: AC
  Filled 2015-04-10: qty 30

## 2015-04-10 MED ORDER — CITRIC ACID-SODIUM CITRATE 334-500 MG/5ML PO SOLN
30.0000 mL | ORAL | Status: DC | PRN
  Administered 2015-04-10: 30 mL via ORAL
  Filled 2015-04-10: qty 30

## 2015-04-10 MED ORDER — SODIUM CHLORIDE 0.9 % IJ SOLN
INTRAMUSCULAR | Status: AC
  Administered 2015-04-10: 3 mL
  Filled 2015-04-10: qty 10

## 2015-04-10 MED ORDER — LIDOCAINE HCL (PF) 1 % IJ SOLN
30.0000 mL | INTRAMUSCULAR | Status: DC | PRN
  Administered 2015-04-10: 30 mL via SUBCUTANEOUS
  Filled 2015-04-10: qty 30

## 2015-04-10 MED ORDER — OXYTOCIN 40 UNITS IN LACTATED RINGERS INFUSION - SIMPLE MED
1.0000 m[IU]/min | INTRAVENOUS | Status: DC
  Administered 2015-04-10: 11 m[IU]/min via INTRAVENOUS
  Administered 2015-04-10: 1 m[IU]/min via INTRAVENOUS

## 2015-04-10 MED ORDER — ONDANSETRON HCL 4 MG/2ML IJ SOLN
4.0000 mg | Freq: Four times a day (QID) | INTRAMUSCULAR | Status: DC | PRN
  Administered 2015-04-10: 4 mg via INTRAVENOUS
  Filled 2015-04-10: qty 2

## 2015-04-10 MED ORDER — AMMONIA AROMATIC IN INHA
RESPIRATORY_TRACT | Status: AC
  Filled 2015-04-10: qty 10

## 2015-04-10 MED ORDER — SIMETHICONE 80 MG PO CHEW: 80.0000 mg | CHEWABLE_TABLET | ORAL | Status: DC | PRN

## 2015-04-10 MED ORDER — DIBUCAINE 1 % RE OINT: 1.0000 "application " | TOPICAL_OINTMENT | RECTAL | Status: DC | PRN

## 2015-04-10 MED ORDER — OXYTOCIN BOLUS FROM INFUSION: 500.0000 mL | INTRAVENOUS | Status: DC

## 2015-04-10 MED ORDER — OXYTOCIN 40 UNITS IN LACTATED RINGERS INFUSION - SIMPLE MED
62.5000 mL/h | INTRAVENOUS | Status: DC
  Filled 2015-04-10: qty 1000

## 2015-04-10 MED ORDER — PRENATAL MULTIVITAMIN CH
1.0000 | ORAL_TABLET | Freq: Every day | ORAL | Status: DC
  Administered 2015-04-11: 1 via ORAL
  Filled 2015-04-10: qty 1

## 2015-04-10 MED ORDER — OXYTOCIN 40 UNITS IN LACTATED RINGERS INFUSION - SIMPLE MED
62.5000 mL/h | INTRAVENOUS | Status: DC | PRN
Start: 2015-04-10 — End: 2015-04-12

## 2015-04-10 MED ORDER — LACTATED RINGERS IV SOLN
INTRAVENOUS | Status: DC
Start: 2015-04-10 — End: 2015-04-10
  Administered 2015-04-10 (×2): via INTRAVENOUS

## 2015-04-10 MED ORDER — BENZOCAINE-MENTHOL 20-0.5 % EX AERO: 1.0000 "application " | INHALATION_SPRAY | CUTANEOUS | Status: DC | PRN

## 2015-04-10 MED ORDER — FAMOTIDINE 20 MG PO TABS: 20.0000 mg | ORAL_TABLET | Freq: Two times a day (BID) | ORAL | Status: DC | PRN

## 2015-04-10 MED ORDER — OXYTOCIN 10 UNIT/ML IJ SOLN
INTRAMUSCULAR | Status: AC
  Filled 2015-04-10: qty 2

## 2015-04-10 MED ORDER — TERBUTALINE SULFATE 1 MG/ML IJ SOLN: 0.2500 mg | Freq: Once | INTRAMUSCULAR | Status: DC | PRN

## 2015-04-10 MED ORDER — ACETAMINOPHEN 325 MG PO TABS
650.0000 mg | ORAL_TABLET | ORAL | Status: DC | PRN
Start: 2015-04-10 — End: 2015-04-12

## 2015-04-10 MED ORDER — DOCUSATE SODIUM 100 MG PO CAPS: 100.0000 mg | ORAL_CAPSULE | Freq: Two times a day (BID) | ORAL | Status: DC | PRN

## 2015-04-10 MED ORDER — FENTANYL CITRATE (PF) 100 MCG/2ML IJ SOLN
50.0000 ug | INTRAMUSCULAR | Status: DC | PRN
  Administered 2015-04-10 (×2): 50 ug via INTRAVENOUS
  Filled 2015-04-10 (×2): qty 2

## 2015-04-10 MED ORDER — IBUPROFEN 600 MG PO TABS
600.0000 mg | ORAL_TABLET | Freq: Four times a day (QID) | ORAL | Status: DC
  Administered 2015-04-10 – 2015-04-12 (×6): 600 mg via ORAL
  Filled 2015-04-10 (×6): qty 1

## 2015-04-10 MED ORDER — CALCIUM CARBONATE ANTACID 500 MG PO CHEW
1.0000 | CHEWABLE_TABLET | Freq: Two times a day (BID) | ORAL | Status: DC | PRN
  Administered 2015-04-10 (×3): 200 mg via ORAL
  Filled 2015-04-10 (×3): qty 1

## 2015-04-10 MED ORDER — OXYCODONE-ACETAMINOPHEN 5-325 MG PO TABS
1.0000 | ORAL_TABLET | Freq: Four times a day (QID) | ORAL | Status: DC | PRN
  Administered 2015-04-10 – 2015-04-12 (×6): 1 via ORAL
  Filled 2015-04-10 (×6): qty 1

## 2015-04-10 NOTE — Plan of Care (Signed)
Decrease in fhr noted at 1524-25 to 70's.  When RN entered room, US monitor immediately adjusted on abdoman and Fhr was 150 bpm. Did not hear gradual increase. Unsure if tracing maternal or fetal heart rate during this  Time. Mother had repositioned with leg raised up to her abd and when released the fhr immed returned to normal tracing. MD informed of action. Robin Myers Jacinda Kanady RNC

## 2015-04-10 NOTE — Discharge Summary (Addendum)
Obstetrical Discharge Summary  Date of Admission: 04/10/2015 Date of Discharge: 04/12/2015  Primary OB: [facility]  Gestational Age at Delivery: 4934w5d   Antepartum complications: BMI 39, h/o HSV, h/o depression and PTSD, chronic sciatic pain, migraines, h/o pre-eclampsia, close interval pregnancy Reason for Admission: prolonged rupture of membranes Date of Delivery: 04/10/2015  Delivered By: Robin Myers, Robin delivered by [facility] Bingharlie Myers, Robin Myers Delivery Type: spontaneous vaginal delivery Intrapartum complications/course: PROM, rapid labor Anesthesia: local Robin: expressed. Intact: yes. To pathology: no.  Laceration: 1st degree, repaired Episiotomy: none Baby: Liveborn female, APGARs8/9, weight 2670 g.   Postpartum course: Routine postpartum course, meeting goals of ambulation, voiding, tolerating po, and good pain control on po pain meds.  She is formula feeding. She wants an IUD for contraception. Discharge Vital Signs:  Current Vital Signs 24h Vital Sign Ranges  T 98.3 F (36.8 C) Temp  Avg: 98.1 F (36.7 C)  Min: 97.5 F (36.4 C)  Max: 98.3 F (36.8 C)  BP 120/75 mmHg BP  Min: 115/73  Max: 132/78  HR 82 Pulse  Avg: 67.8  Min: 60  Max: 82  RR 20 Resp  Avg: 18.7  Min: 18  Max: 20  SaO2 99 % Not Delivered SpO2  Avg: 99 %  Min: 99 %  Max: 99 %       24 Hour I/O Current Shift I/O  Time Ins Outs 11/23 0701 - 11/24 0700 In: 240 [P.O.:240] Out: -      Discharge Exam:  Gen: NAD Abdomen: firm fundus below the umbilicus.  CV: RRR no MRGs Pulm: CTAB Ext: no c/c/e  Disposition: Home  Rh Immune globulin given: not applicable Rubella vaccine given: not applicable Tdap vaccine given in AP or PP setting: yes Flu vaccine given in AP or PP setting: yes  Contraception: desires Mirena IUD  Prenatal/Postnatal Panel: O POS//Rubella Immune//Varicella Immune//RPR non-reactive//HIV negative/HepB Surface Ag negative//pap CIN 12/2013 //plans to formula  feed  Plan:  Robin Myers was discharged to home in good condition. Follow-up appointment with Robin Myers in 1 week for a mood check.   Discharge Medications:   Medication List    TAKE these medications        acetaminophen 325 MG tablet  Commonly known as:  TYLENOL  Take 650 mg by mouth as needed.     albuterol 108 (90 BASE) MCG/ACT inhaler  Commonly known as:  PROVENTIL HFA;VENTOLIN HFA  Inhale 2 puffs into the lungs every 4 (four) hours as needed for wheezing or shortness of breath.     HYDROcodone-acetaminophen 5-325 MG tablet  Commonly known as:  NORCO  Take 1 tablet by mouth every 6 (six) hours as needed for moderate pain.     ibuprofen 600 MG tablet  Commonly known as:  ADVIL,MOTRIN  Take 1 tablet (600 mg total) by mouth every 6 (six) hours.     PRENATAL 19 tablet  Take 1 tablet by mouth daily.       Follow-up Information    Follow up with Robin Myers, Robin Myers. Schedule an appointment as soon as possible for a visit on 04/23/2015.   Specialty:  Obstetrics and Gynecology   Why:  postpartum mood check   Contact information:   [address] [place] [address] [phone]      Robin MohairStephen Aralynn Myers, Robin Myers 04/12/2015 9:24 AM

## 2015-04-10 NOTE — Progress Notes (Signed)
Dr. Vergie LivingPickens called to report pt was 8cm with a fore bag and would probably push through.

## 2015-04-10 NOTE — Progress Notes (Signed)
Transferred via w/c in stable condition to RM 346 with infant to nursery.

## 2015-04-10 NOTE — Progress Notes (Signed)
Pt was assisted to bathroom she voided 200 ml.   Shown pericare, clean gown, pad and icepack on.  Pt ambulated to couch to hold infant.   She is stable at this time.

## 2015-04-10 NOTE — H&P (Addendum)
Obstetrics Admission History & Physical  04/10/2015 - 10:46 AM Primary OBGYN: [facility]  Chief Complaint: SROM @ 0300 today   History of Present Illness  29 y.o. [id number] @ 9228w5d (Dating: EDC 12/8), with the above CC. Pregnancy complicated by: BMI 39, h/o HSV, depression and PTSD, chronic sciatic pain, migraines, h/o pre-eclampsia, CI pregnancy (02/2014 SVD)  Ms. Robin Myers states that she had LOF clear since 0300. No VB, decreased FM and irregular UCs.  Review of Systems: her 12 point review of systems is negative or as noted in the History of Present Illness.  PMHx: as above  PSHx:  Past Surgical History  Procedure Laterality Date  . Hernia repair     Medications:  Prescriptions prior to admission  Medication Sig Dispense Refill Last Dose  . acetaminophen (TYLENOL) 325 MG tablet Take 650 mg by mouth as needed.   Not Taking at Unknown time  . albuterol (PROVENTIL HFA;VENTOLIN HFA) 108 (90 BASE) MCG/ACT inhaler Inhale 2 puffs into the lungs every 4 (four) hours as needed for wheezing or shortness of breath. 1 Inhaler 2 Not Taking at Unknown time  . Prenatal Vit-DSS-Fe Fum-FA (PRENATAL 19) tablet Take 1 tablet by mouth daily.   Not Taking at Unknown time     Allergies: is allergic to latex. OBHx:  OB History  Gravida Para Term Preterm AB SAB TAB Ectopic Multiple Living  3 2 0 0      2    # Outcome Date GA Lbr Len/2nd Weight Sex Delivery Anes PTL Lv  3 Current           2 Para 02/25/14    F    Y  1 Para 04/16/06      None Y     h/o 36wk PTB largst child 5lbs  GYNHx:  History of abnormal pap smears: No. History of STIs: No..             FHx: No family history on file. Soc Hx:  Social History   Social History  . Marital Status: Single    Spouse Name: N/A  . Number of Children: N/A  . Years of Education: N/A   Occupational History  . Not on file.   Social History Main Topics  . Smoking status: Light Tobacco Smoker  . Smokeless tobacco: Never Used  . Alcohol  Use: No  . Drug Use: No  . Sexual Activity: Yes    Birth Control/ Protection: None   Other Topics Concern  . Not on file   Social History Narrative    Objective    Current Vital Signs 24h Vital Sign Ranges  T  afebrile No Data Recorded  BP 124/71 mmHg BP  Min: 124/71  Max: 124/71  HR 100 Pulse  Avg: 100  Min: 100  Max: 100  RR  20 No Data Recorded  SaO2    98 No Data Recorded       24 Hour I/O Current Shift I/O  Time Ins Outs        EFM: 140 baseline, +accels, no decels, mod variability  Toco: quiet, irregular  General: Well nourished, well developed female in no acute distress.  Skin:  Warm and dry.  Cardiovascular: Regular rate and rhythm. Respiratory:  Clear to auscultation bilateral. Normal respiratory effort Abdomen: gravid, nttp Neuro/Psych:  Normal mood and affect.   SSE: +clear fluid, pooling, visually dilated to 2cm. No lesions noted SVE: 2/50/-3 @ 1030am, cephalic. Pelvis feels adequate Leopolds/EFW: 3500gm  Labs  pending  Radiology none  Perinatal info  O pos/ Rubella  Immune / Varicella Immune/RPR pending/HIV Negative/HepB Surf Ag Negative/TDaP:yes / Flu shot: needed/pap neg 2015/  Assessment & Plan   29 y.o. [id number] @ [redacted]w[redacted]d with premature ROM, currently stable *IUP: category I with accels *PROM: patient amenable to starting pitocin to decrease infection risk.  *GBS: negative *Analgesia: no needs currently *Psych: has h/o SI in the remote past. needs early PP follow up and consider SW consult PP *Ortho: no issues currently  Cornelia Copa. MD [facility] Pager [phone]

## 2015-04-10 NOTE — Progress Notes (Signed)
Pt encouraged not push until MD arrives. She was non compliant and pushed infant out immediately.

## 2015-04-10 NOTE — Progress Notes (Signed)
L&D Note  04/10/2015 - 3:02 PM  29 y.o. [id number] @ [redacted]w[redacted]d (Dating: EDC 12/8), with the above CC. Pregnancy complicated by: BMI 39, h/o HSV, depression and PTSD, chronic sciatic pain, migraines, h/o pre-eclampsia, CI pregnancy (02/2014 SVD)  Robin Myers is admitted for premature ROM   Subjective:  Not feeling any UCs   Objective:    Current Vital Signs 24h Vital Sign Ranges  T   No Data Recorded  BP 125/84 mmHg BP  Min: 124/71  Max: 125/84  HR 98 Pulse  Avg: 99  Min: 98  Max: 100  RR   No Data Recorded  SaO2     No Data Recorded       24 Hour I/O Current Shift I/O  Time Ins Outs       FHR: 130 baseline, +accels, two decels noted, mod var Toco: difficult to trace Gen: NAD SVE: unchanged at 2/50/-3/cephalic, clear fluid-->IUPC placed   Recent Labs Lab 04/10/15 1139  WBC 10.2  HGB 10.9*  HCT 33.8*  PLT 275   O POS  Medications Current Facility-Administered Medications  Medication Dose Route Frequency Provider Last Rate Last Dose  . acetaminophen (TYLENOL) tablet 650 mg  650 mg Oral Q4H PRN [place] Bing, MD      . ammonia inhalant           . calcium carbonate (TUMS - dosed in mg elemental calcium) chewable tablet 200 mg of elemental calcium  1 tablet Oral BID PRN [place] Bing, MD   200 mg of elemental calcium at 04/10/15 1124  . citric acid-sodium citrate (ORACIT) solution 30 mL  30 mL Oral Q2H PRN [place] Bing, MD   30 mL at 04/10/15 1200  . famotidine (PEPCID) tablet 20 mg  20 mg Oral BID PRN [place] Bing, MD      . lactated ringers infusion 500-1,000 mL  500-1,000 mL Intravenous PRN [place] Bing, MD      . lactated ringers infusion   Intravenous Continuous [place] Bing, MD 125 mL/hr at 04/10/15 1140    . lidocaine (PF) (XYLOCAINE) 1 % injection 30 mL  30 mL Subcutaneous PRN [place] Bing, MD      . lidocaine (PF) (XYLOCAINE) 1 % injection           . misoprostol (CYTOTEC) 200 MCG tablet           . ondansetron (ZOFRAN) injection 4 mg  4  mg Intravenous Q6H PRN [place] Bing, MD   4 mg at 04/10/15 1210  . oxytocin (PITOCIN) 10 UNIT/ML injection           . oxytocin (PITOCIN) IV BOLUS FROM BAG  500 mL Intravenous Continuous [place] Bing, MD      . oxytocin (PITOCIN) IV infusion 40 units in LR 1000 mL  62.5 mL/hr Intravenous Continuous [place] Bing, MD      . oxytocin (PITOCIN) IV infusion 40 units in LR 1000 mL  1-40 milli-units/min Intravenous Titrated [place] Bing, MD 1.5 mL/hr at 04/10/15 1155 1 milli-units/min at 04/10/15 1155  . terbutaline (BRETHINE) injection 0.25 mg  0.25 mg Subcutaneous Once PRN [place] Bing, MD        Assessment & Plan:  Pt stable *IUP: currently category I tracing. *PROM: difficult to trace and titrate pitocin, currently at 1 w/o IUPC so d/w pt and reasoning for placement, in addition to better delineate any future decels. Continue pitocin per protocol *GBS: negative *Analgesia: no needs; may have epidural whenever she desires  Billey Gosling  Lonia SkinnerPickens, Jr MD [facility] Pager [phone]

## 2015-04-10 NOTE — Discharge Instructions (Signed)
Vaginal Delivery, Care After Refer to this sheet in the next few weeks. These discharge instructions provide you with information on caring for yourself after delivery. Your caregiver may also give you specific instructions. Your treatment has been planned according to the most current medical practices available, but problems sometimes occur. Call your caregiver if you have any problems or questions after you go home. HOME CARE INSTRUCTIONS 1. Take over-the-counter or prescription medicines only as directed by your caregiver or pharmacist. 2. Do not drink alcohol, especially if you are breastfeeding or taking medicine to relieve pain. 3. Do not smoke tobacco. 4. Continue to use good perineal care. Good perineal care includes: 1. Wiping your perineum from back to front 2. Keeping your perineum clean. 3. You can do sitz baths twice a day, to help keep this area clean 5. Do not use tampons, douche or have sex until your caregiver says it is okay. 6. Shower only and avoid sitting in submerged water, aside from sitz baths 7. Wear a well-fitting bra that provides breast support. 8. Eat healthy foods. 9. Drink enough fluids to keep your urine clear or pale yellow. 10. Eat high-fiber foods such as whole grain cereals and breads, brown rice, beans, and fresh fruits and vegetables every day. These foods may help prevent or relieve constipation. 11. Avoid constipation with high fiber foods or medications, such as miralax or metamucil 12. Follow your caregiver's recommendations regarding resumption of activities such as climbing stairs, driving, lifting, exercising, or traveling. 13. Talk to your caregiver about resuming sexual activities. Resumption of sexual activities is dependent upon your risk of infection, your rate of healing, and your comfort and desire to resume sexual activity. 14. Try to have someone help you with your household activities and your newborn for at least a few days after you leave  the hospital. 15. Rest as much as possible. Try to rest or take a nap when your newborn is sleeping. 16. Increase your activities gradually. 17. Keep all of your scheduled postpartum appointments. It is very important to keep your scheduled follow-up appointments. At these appointments, your caregiver will be checking to make sure that you are healing physically and emotionally. SEEK MEDICAL CARE IF:   You are passing large clots from your vagina. Save any clots to show your caregiver.  You have a foul smelling discharge from your vagina.  You have trouble urinating.  You are urinating frequently.  You have pain when you urinate.  You have a change in your bowel movements.  You have increasing redness, pain, or swelling near your vaginal incision (episiotomy) or vaginal tear.  You have pus draining from your episiotomy or vaginal tear.  Your episiotomy or vaginal tear is separating.  You have painful, hard, or reddened breasts.  You have a severe headache.  You have blurred vision or see spots.  You feel sad or depressed.  You have thoughts of hurting yourself or your newborn.  You have questions about your care, the care of your newborn, or medicines.  You are dizzy or light-headed.  You have a rash.  You have nausea or vomiting.  You were breastfeeding and have not had a menstrual period within 12 weeks after you stopped breastfeeding.  You are not breastfeeding and have not had a menstrual period by the 12th week after delivery.  You have a fever. SEEK IMMEDIATE MEDICAL CARE IF:   You have persistent pain.  You have chest pain.  You have shortness of breath.    You faint.  You have leg pain.  You have stomach pain.  Your vaginal bleeding saturates two or more sanitary pads in 1 hour. MAKE SURE YOU:   Understand these instructions.  Will watch your condition.  Will get help right away if you are not doing well or get worse. Document Released:  05/02/2000 Document Revised: 09/19/2013 Document Reviewed: 12/31/2011 ExitCare Patient Information 2015 [employer]. This information is not intended to replace advice given to you by your health care provider. Make sure you discuss any questions you have with your health care provider.  Sitz Bath A sitz bath is a warm water bath taken in the sitting position. The water covers only the hips and butt (buttocks). We recommend using one that fits in the toilet, to help with ease of use and cleanliness. It may be used for either healing or cleaning purposes. Sitz baths are also used to relieve pain, itching, or muscle tightening (spasms). The water may contain medicine. Moist heat will help you heal and relax.  HOME CARE  Take 3 to 4 sitz baths a day. 18. Fill the bathtub half-full with warm water. 19. Sit in the water and open the drain a little. 20. Turn on the warm water to keep the tub half-full. Keep the water running constantly. 21. Soak in the water for 15 to 20 minutes. 22. After the sitz bath, pat the affected area dry. GET HELP RIGHT AWAY IF: You get worse instead of better. Stop the sitz baths if you get worse. MAKE SURE YOU:  Understand these instructions.  Will watch your condition.  Will get help right away if you are not doing well or get worse. Document Released: 06/12/2004 Document Revised: 01/28/2012 Document Reviewed: 09/02/2010 ExitCare Patient Information 2015 [employer]. This information is not intended to replace advice given to you by your health care provider. Make sure you discuss any questions you have with your health care provider.    

## 2015-04-11 LAB — RPR: RPR: NONREACTIVE

## 2015-04-11 MED ORDER — CALCIUM CARBONATE ANTACID 500 MG PO CHEW
1.0000 | CHEWABLE_TABLET | Freq: Once | ORAL | Status: AC
  Administered 2015-04-11: 200 mg via ORAL
  Filled 2015-04-11: qty 1

## 2015-04-11 NOTE — Progress Notes (Signed)
Candice Campynthia Dougherty, RN called out to the desk to page Dr. Vergie LivingPickens because the patient was pushing and she could see the head.  Aram BeechamCynthia RN called out again and said the baby was out.  I came in and Aram BeechamCynthia RN was holding the crying infant and I donned sterile gloves and checked umbilical cord pulse and visualized vigorous infant and no vaginal bleeding noted.  Dr. Vergie LivingPickens arrived to complete third stage of labor and continue care.    Carlean JewsMeredith Sigmon, CNM

## 2015-04-11 NOTE — Progress Notes (Signed)
  Subjective:  Doing well moderate lochia  Objective:   Blood pressure 116/74, pulse 59, temperature 98 F (36.7 C), temperature source Oral, resp. rate 18, last menstrual period 07/20/2014, SpO2 100 %, unknown if currently breastfeeding.  General: NAD Pulmonary: no increased work of breathing Abdomen: non-distended, non-tender, fundus firm at level of umbilicus Extremities: no edema, no erythema, no tenderness  Results for orders placed or performed during the hospital encounter of 04/10/15 (from the past 72 hour(s))  CBC     Status: Abnormal   Collection Time: 04/10/15 11:39 AM  Result Value Ref Range   WBC 10.2 3.6 - 11.0 K/uL   RBC 4.34 3.80 - 5.20 MIL/uL   Hemoglobin 10.9 (L) 12.0 - 16.0 g/dL   HCT 16.133.8 (L) 09.635.0 - 04.547.0 %   MCV 77.9 (L) 80.0 - 100.0 fL   MCH 25.2 (L) 26.0 - 34.0 pg   MCHC 32.3 32.0 - 36.0 g/dL   RDW 40.916.1 (H) 81.111.5 - 91.414.5 %   Platelets 275 150 - 440 K/uL  Type and screen [facility]     Status: None   Collection Time: 04/10/15 11:39 AM  Result Value Ref Range   ABO/RH(D) O POS    Antibody Screen NEG    Sample Expiration 04/13/2015   RPR     Status: None   Collection Time: 04/10/15 11:39 AM  Result Value Ref Range   RPR Ser Ql Non Reactive Non Reactive    Comment: (NOTE) Performed At: [facility] [address] [place], [place] [id number] Mila HomerHancock William F MD [id number]:[phone]   ABO/Rh     Status: None   Collection Time: 04/10/15 11:40 AM  Result Value Ref Range   ABO/RH(D) O POS     Assessment:   29 y.o. G3P1002 postpartum day # 1 TSVD  Plan:    1) Acute blood loss anemia - hemodynamically stable and asymptomatic - po ferrous sulfate  2) O pos / RI / VZI   3) TDAP up to date  4) Bottle/Mirena  5) Disposition - discharge home PPD2

## 2015-04-12 MED ORDER — IBUPROFEN 600 MG PO TABS: 600.0000 mg | ORAL_TABLET | Freq: Four times a day (QID) | ORAL | Status: DC

## 2015-04-12 MED ORDER — HYDROCODONE-ACETAMINOPHEN 5-325 MG PO TABS: 1.0000 | ORAL_TABLET | Freq: Four times a day (QID) | ORAL | Status: DC | PRN

## 2015-04-12 NOTE — Progress Notes (Signed)
Patient understands all discharge instructions and the need to make follow up appointments. Patient discharge via wheelchair with auxillary. 

## 2015-04-16 ENCOUNTER — Emergency Department
Admission: EM | Admit: 2015-04-16 | Discharge: 2015-04-16 | Disposition: A | Discharge status: Home / Self Care | Payer: Medicaid Other | Attending: Emergency Medicine | Admitting: Emergency Medicine

## 2015-04-16 ENCOUNTER — Encounter: Payer: Self-pay | Admitting: Emergency Medicine

## 2015-04-16 ENCOUNTER — Emergency Department: Payer: Medicaid Other

## 2015-04-16 DIAGNOSIS — R51 Headache: Secondary | ICD-10-CM | POA: Diagnosis not present

## 2015-04-16 DIAGNOSIS — F17200 Nicotine dependence, unspecified, uncomplicated: Secondary | ICD-10-CM | POA: Insufficient documentation

## 2015-04-16 DIAGNOSIS — H538 Other visual disturbances: Secondary | ICD-10-CM | POA: Diagnosis not present

## 2015-04-16 DIAGNOSIS — R519 Headache, unspecified: Secondary | ICD-10-CM

## 2015-04-16 DIAGNOSIS — R102 Pelvic and perineal pain: Secondary | ICD-10-CM | POA: Diagnosis not present

## 2015-04-16 DIAGNOSIS — O99335 Smoking (tobacco) complicating the puerperium: Secondary | ICD-10-CM | POA: Insufficient documentation

## 2015-04-16 DIAGNOSIS — O9089 Other complications of the puerperium, not elsewhere classified: Secondary | ICD-10-CM | POA: Insufficient documentation

## 2015-04-16 DIAGNOSIS — Z91040 Latex allergy status: Secondary | ICD-10-CM | POA: Insufficient documentation

## 2015-04-16 LAB — CBC WITH DIFFERENTIAL/PLATELET
Basophils Absolute: 0 10*3/uL (ref 0–0.1)
Basophils Relative: 0 %
EOS ABS: 0.1 10*3/uL (ref 0–0.7)
Eosinophils Relative: 2 %
HEMATOCRIT: 33.1 % — AB (ref 35.0–47.0)
HEMOGLOBIN: 10.3 g/dL — AB (ref 12.0–16.0)
LYMPHS ABS: 1.8 10*3/uL (ref 1.0–3.6)
Lymphocytes Relative: 21 %
MCH: 24.4 pg — AB (ref 26.0–34.0)
MCHC: 31.2 g/dL — ABNORMAL LOW (ref 32.0–36.0)
MCV: 78.4 fL — ABNORMAL LOW (ref 80.0–100.0)
MONOS PCT: 4 %
Monocytes Absolute: 0.4 10*3/uL (ref 0.2–0.9)
NEUTROS ABS: 6.5 10*3/uL (ref 1.4–6.5)
NEUTROS PCT: 73 %
Platelets: 249 10*3/uL (ref 150–440)
RBC: 4.23 MIL/uL (ref 3.80–5.20)
RDW: 16.6 % — ABNORMAL HIGH (ref 11.5–14.5)
WBC: 8.8 10*3/uL (ref 3.6–11.0)

## 2015-04-16 LAB — PROTIME-INR
INR: 0.95
Prothrombin Time: 12.9 seconds (ref 11.4–15.0)

## 2015-04-16 LAB — COMPREHENSIVE METABOLIC PANEL
ALBUMIN: 2.7 g/dL — AB (ref 3.5–5.0)
ALK PHOS: 119 U/L (ref 38–126)
ALT: 31 U/L (ref 14–54)
AST: 24 U/L (ref 15–41)
Anion gap: 4 — ABNORMAL LOW (ref 5–15)
BILIRUBIN TOTAL: 0.2 mg/dL — AB (ref 0.3–1.2)
BUN: 14 mg/dL (ref 6–20)
CALCIUM: 7.8 mg/dL — AB (ref 8.9–10.3)
CO2: 25 mmol/L (ref 22–32)
CREATININE: 0.84 mg/dL (ref 0.44–1.00)
Chloride: 110 mmol/L (ref 101–111)
GFR calc non Af Amer: >60 mL/min (ref >60)
GLUCOSE: 101 mg/dL — AB (ref 65–99)
Potassium: 3.6 mmol/L (ref 3.5–5.1)
SODIUM: 139 mmol/L (ref 135–145)
TOTAL PROTEIN: 6.2 g/dL — AB (ref 6.5–8.1)

## 2015-04-16 LAB — URIC ACID: Uric Acid, Serum: 6.3 mg/dL (ref 2.3–6.6)

## 2015-04-16 LAB — FIBRINOGEN: Fibrinogen: 437 mg/dL (ref 210–470)

## 2015-04-16 MED ORDER — DIPHENHYDRAMINE HCL 50 MG/ML IJ SOLN
25.0000 mg | Freq: Once | INTRAMUSCULAR | Status: AC
  Administered 2015-04-16: 25 mg via INTRAVENOUS
  Filled 2015-04-16: qty 1

## 2015-04-16 MED ORDER — METOCLOPRAMIDE HCL 5 MG/ML IJ SOLN
10.0000 mg | Freq: Once | INTRAMUSCULAR | Status: AC
  Administered 2015-04-16: 10 mg via INTRAVENOUS
  Filled 2015-04-16: qty 2

## 2015-04-16 MED ORDER — SODIUM CHLORIDE 0.9 % IV SOLN
Freq: Once | INTRAVENOUS | Status: AC
  Administered 2015-04-16: 14:00:00 via INTRAVENOUS

## 2015-04-16 NOTE — Discharge Instructions (Signed)

## 2015-04-16 NOTE — ED Notes (Signed)
Sent in by her pmd with HTN  S/p post partum 1 week   Also having headache and blurred vision

## 2015-04-16 NOTE — ED Notes (Signed)
Pt states she gave birth to child last Tuesday and on Friday she started to have HA, blurred vision, and lower back pain with cramping. Pt went to PCP this AM and the concern is pts BP elevation.

## 2015-04-16 NOTE — ED Provider Notes (Signed)
[facility] Emergency Department Provider Note     Time seen: ----------------------------------------- 1:12 PM on 04/16/2015 -----------------------------------------    I have reviewed the triage vital signs and the nursing notes.   HISTORY  Chief Complaint Hypertension    HPI Robin Myers is a 29 y.o. female sent to the ER by her primary care doctor with hypertension. She is 1 week postpartum with headache and blurred vision.Patient states she doesn't history of headaches but this is different than normal. Pain seems to be in both temples, she states she's having some pelvic cramping but not out of the ordinary. She discussed this with her OB/GYN doctor who sent her here for evaluation. She does have a history of severe preeclampsia.   Past Medical History  Diagnosis Date  . Sciatica   . Heart murmur   . Pre-eclampsia   . Preeclampsia   . Preeclampsia   . Preeclampsia     Patient Active Problem List   Diagnosis Date Noted  . Supervision of normal pregnancy in third trimester 04/10/2015  . Labor and delivery, indication for care 03/30/2015  . Flu-like symptoms 02/26/2015  . Headache 02/16/2015  . Indication for care in labor and delivery, antepartum 02/08/2015  . Major depression, recurrent (HCC) 12/19/2014  . Posttraumatic stress disorder 12/19/2014  . Preeclampsia     Past Surgical History  Procedure Laterality Date  . Hernia repair      Allergies Latex  Social History Social History  Substance Use Topics  . Smoking status: Light Tobacco Smoker  . Smokeless tobacco: Never Used  . Alcohol Use: No    Review of Systems Constitutional: Negative for fever. Eyes: Negative for visual changes. ENT: Negative for sore throat. Cardiovascular: Negative for chest pain. Respiratory: Negative for shortness of breath. Gastrointestinal: Negative for abdominal pain, vomiting and diarrhea. Genitourinary: Negative for dysuria. Positive  for postpartum bleeding and cramping Musculoskeletal: Negative for back pain. Skin: Negative for rash. Neurological: Positive for headache, negative for focal weakness or numbness.  10-point ROS otherwise negative.  ____________________________________________   PHYSICAL EXAM:  VITAL SIGNS: ED Triage Vitals  Enc Vitals Group     BP 04/16/15 1302 135/97 mmHg     Pulse Rate 04/16/15 1302 94     Resp 04/16/15 1302 20     Temp 04/16/15 1302 97.7 F (36.5 C)     Temp Source 04/16/15 1302 Oral     SpO2 04/16/15 1302 98 %     Weight 04/16/15 1302 222 lb (100.699 kg)     Height 04/16/15 1302 5\' 5"  (1.651 m)     Head Cir --      Peak Flow --      Pain Score 04/16/15 1302 9     Pain Loc --      Pain Edu? --      Excl. in GC? --     Constitutional: Alert and oriented. Well appearing and in no distress. Eyes: Conjunctivae are normal. PERRL. Normal extraocular movements. ENT   Head: Normocephalic and atraumatic.   Nose: No congestion/rhinnorhea.   Mouth/Throat: Mucous membranes are moist.   Neck: No stridor. Cardiovascular: Normal rate, regular rhythm. Normal and symmetric distal pulses are present in all extremities. No murmurs, rubs, or gallops. Respiratory: Normal respiratory effort without tachypnea nor retractions. Breath sounds are clear and equal bilaterally. No wheezes/rales/rhonchi. Gastrointestinal: Soft and nontender. No distention. No abdominal bruits.  Musculoskeletal: Nontender with normal range of motion in all extremities. No joint effusions.  No  lower extremity tenderness nor edema. Neurologic:  Normal speech and language. No gross focal neurologic deficits are appreciated. Speech is normal. No gait instability. Skin:  Skin is warm, dry and intact. No rash noted. Psychiatric: Mood and affect are normal. Speech and behavior are normal. Patient exhibits appropriate insight and judgment. ____________________________________________  ED COURSE:  Pertinent  labs & imaging results that were available during my care of the patient were reviewed by me and considered in my medical decision making (see chart for details). Patients in no acute distress, will give IV headache medication and check preeclampsia labs. ____________________________________________    LABS (pertinent positives/negatives)  Labs Reviewed  CBC WITH DIFFERENTIAL/PLATELET - Abnormal; Notable for the following:    Hemoglobin 10.3 (*)    HCT 33.1 (*)    MCV 78.4 (*)    MCH 24.4 (*)    MCHC 31.2 (*)    RDW 16.6 (*)    All other components within normal limits  COMPREHENSIVE METABOLIC PANEL - Abnormal; Notable for the following:    Glucose, Bld 101 (*)    Calcium 7.8 (*)    Total Protein 6.2 (*)    Albumin 2.7 (*)    Total Bilirubin 0.2 (*)    Anion gap 4 (*)    All other components within normal limits  URIC ACID  PROTIME-INR  FIBRINOGEN    RADIOLOGY  IMPRESSION: Normal head CT.  ____________________________________________  FINAL ASSESSMENT AND PLAN  Headache  Plan: Patient with labs and imaging as dictated above. Patient is in no acute distress, discussed with OB/GYN. Her labs look unremarkable her headache is better and her blood pressure is 125/78. She is stable for outpatient follow-up with her doctor. Emily Filbert, MD   Emily Filbert, MD 04/16/15 1556

## 2015-05-08 ENCOUNTER — Other Ambulatory Visit: Payer: Self-pay | Admitting: Neurology

## 2015-05-08 DIAGNOSIS — R29898 Other symptoms and signs involving the musculoskeletal system: Secondary | ICD-10-CM

## 2015-05-08 DIAGNOSIS — M545 Low back pain: Secondary | ICD-10-CM

## 2015-06-04 ENCOUNTER — Ambulatory Visit
Admission: RE | Admit: 2015-06-04 | Discharge: 2015-06-04 | Disposition: A | Discharge status: Home / Self Care | Payer: Medicaid Other | Source: Ambulatory Visit | Attending: Neurology | Admitting: Neurology

## 2015-06-04 DIAGNOSIS — M5440 Lumbago with sciatica, unspecified side: Secondary | ICD-10-CM | POA: Insufficient documentation

## 2015-06-04 DIAGNOSIS — R29898 Other symptoms and signs involving the musculoskeletal system: Secondary | ICD-10-CM

## 2015-06-04 DIAGNOSIS — M5127 Other intervertebral disc displacement, lumbosacral region: Secondary | ICD-10-CM | POA: Diagnosis not present

## 2015-06-04 DIAGNOSIS — G8929 Other chronic pain: Secondary | ICD-10-CM | POA: Diagnosis not present

## 2015-06-04 DIAGNOSIS — M4690 Unspecified inflammatory spondylopathy, site unspecified: Secondary | ICD-10-CM | POA: Diagnosis not present

## 2015-06-04 DIAGNOSIS — M6281 Muscle weakness (generalized): Secondary | ICD-10-CM | POA: Insufficient documentation

## 2015-06-04 DIAGNOSIS — M545 Low back pain: Secondary | ICD-10-CM

## 2015-06-04 DIAGNOSIS — R296 Repeated falls: Secondary | ICD-10-CM | POA: Diagnosis present

## 2015-06-07 ENCOUNTER — Other Ambulatory Visit: Payer: Self-pay | Admitting: Neurology

## 2015-06-07 DIAGNOSIS — G8929 Other chronic pain: Secondary | ICD-10-CM

## 2015-06-07 DIAGNOSIS — R29898 Other symptoms and signs involving the musculoskeletal system: Secondary | ICD-10-CM

## 2015-06-07 DIAGNOSIS — R296 Repeated falls: Secondary | ICD-10-CM

## 2015-06-07 DIAGNOSIS — R531 Weakness: Secondary | ICD-10-CM

## 2015-06-07 DIAGNOSIS — M545 Low back pain: Secondary | ICD-10-CM

## 2015-06-15 ENCOUNTER — Emergency Department: Payer: Medicaid Other

## 2015-06-15 ENCOUNTER — Encounter: Payer: Self-pay | Admitting: *Deleted

## 2015-06-15 ENCOUNTER — Emergency Department
Admission: EM | Admit: 2015-06-15 | Discharge: 2015-06-15 | Disposition: A | Discharge status: Home / Self Care | Payer: Medicaid Other | Attending: Emergency Medicine | Admitting: Emergency Medicine

## 2015-06-15 DIAGNOSIS — Z79899 Other long term (current) drug therapy: Secondary | ICD-10-CM | POA: Diagnosis not present

## 2015-06-15 DIAGNOSIS — Z791 Long term (current) use of non-steroidal anti-inflammatories (NSAID): Secondary | ICD-10-CM | POA: Insufficient documentation

## 2015-06-15 DIAGNOSIS — Z91040 Latex allergy status: Secondary | ICD-10-CM | POA: Diagnosis not present

## 2015-06-15 DIAGNOSIS — R000 Tachycardia, unspecified: Secondary | ICD-10-CM | POA: Insufficient documentation

## 2015-06-15 DIAGNOSIS — K529 Noninfective gastroenteritis and colitis, unspecified: Secondary | ICD-10-CM

## 2015-06-15 DIAGNOSIS — F17200 Nicotine dependence, unspecified, uncomplicated: Secondary | ICD-10-CM | POA: Insufficient documentation

## 2015-06-15 DIAGNOSIS — Z3202 Encounter for pregnancy test, result negative: Secondary | ICD-10-CM | POA: Diagnosis not present

## 2015-06-15 DIAGNOSIS — R112 Nausea with vomiting, unspecified: Secondary | ICD-10-CM | POA: Diagnosis present

## 2015-06-15 LAB — COMPREHENSIVE METABOLIC PANEL
ALBUMIN: 3.9 g/dL (ref 3.5–5.0)
ALT: 39 U/L (ref 14–54)
AST: 26 U/L (ref 15–41)
Alkaline Phosphatase: 108 U/L (ref 38–126)
Anion gap: 7 (ref 5–15)
BILIRUBIN TOTAL: 0.7 mg/dL (ref 0.3–1.2)
BUN: 14 mg/dL (ref 6–20)
CHLORIDE: 104 mmol/L (ref 101–111)
CO2: 26 mmol/L (ref 22–32)
Calcium: 9 mg/dL (ref 8.9–10.3)
Creatinine, Ser: 1.08 mg/dL — ABNORMAL HIGH (ref 0.44–1.00)
GFR calc Af Amer: >60 mL/min (ref >60)
GFR calc non Af Amer: >60 mL/min (ref >60)
GLUCOSE: 135 mg/dL — AB (ref 65–99)
POTASSIUM: 3.7 mmol/L (ref 3.5–5.1)
SODIUM: 137 mmol/L (ref 135–145)
TOTAL PROTEIN: 7.7 g/dL (ref 6.5–8.1)

## 2015-06-15 LAB — RAPID INFLUENZA A&B ANTIGENS: Influenza B (ARMC): NEGATIVE

## 2015-06-15 LAB — URINALYSIS COMPLETE WITH MICROSCOPIC (ARMC ONLY)
Bilirubin Urine: NEGATIVE
GLUCOSE, UA: NEGATIVE mg/dL
Hgb urine dipstick: NEGATIVE
Ketones, ur: NEGATIVE mg/dL
Leukocytes, UA: NEGATIVE
Nitrite: NEGATIVE
PROTEIN: NEGATIVE mg/dL
Specific Gravity, Urine: 1.028 (ref 1.005–1.030)
pH: 5 (ref 5.0–8.0)

## 2015-06-15 LAB — CBC
HEMATOCRIT: 39.7 % (ref 35.0–47.0)
Hemoglobin: 12.7 g/dL (ref 12.0–16.0)
MCH: 24.8 pg — AB (ref 26.0–34.0)
MCHC: 32 g/dL (ref 32.0–36.0)
MCV: 77.3 fL — AB (ref 80.0–100.0)
Platelets: 260 10*3/uL (ref 150–440)
RBC: 5.13 MIL/uL (ref 3.80–5.20)
RDW: 18.3 % — ABNORMAL HIGH (ref 11.5–14.5)
WBC: 8.9 10*3/uL (ref 3.6–11.0)

## 2015-06-15 LAB — RAPID INFLUENZA A&B ANTIGENS (ARMC ONLY): INFLUENZA A (ARMC): NEGATIVE

## 2015-06-15 LAB — POCT PREGNANCY, URINE: PREG TEST UR: NEGATIVE

## 2015-06-15 LAB — LIPASE, BLOOD: Lipase: 26 U/L (ref 11–51)

## 2015-06-15 MED ORDER — ACETAMINOPHEN 325 MG PO TABS
ORAL_TABLET | ORAL | Status: AC
  Administered 2015-06-15: 650 mg via ORAL
  Filled 2015-06-15: qty 2

## 2015-06-15 MED ORDER — ONDANSETRON HCL 4 MG PO TABS: 4.0000 mg | ORAL_TABLET | Freq: Every day | ORAL | Status: DC | PRN

## 2015-06-15 MED ORDER — ACETAMINOPHEN 325 MG PO TABS
650.0000 mg | ORAL_TABLET | Freq: Once | ORAL | Status: AC
  Administered 2015-06-15: 650 mg via ORAL

## 2015-06-15 MED ORDER — ONDANSETRON HCL 4 MG/2ML IJ SOLN
4.0000 mg | Freq: Once | INTRAMUSCULAR | Status: AC
  Administered 2015-06-15: 4 mg via INTRAVENOUS
  Filled 2015-06-15: qty 2

## 2015-06-15 MED ORDER — SODIUM CHLORIDE 0.9 % IV SOLN
1000.0000 mL | Freq: Once | INTRAVENOUS | Status: AC
  Administered 2015-06-15: 1000 mL via INTRAVENOUS

## 2015-06-15 NOTE — ED Notes (Signed)
MD at bedside. 

## 2015-06-15 NOTE — ED Notes (Addendum)
Pt states she has vomiting, chills, cough, fever, congestion, bodyaches. No abd pain.  Pt has low back pain and painful urination.

## 2015-06-15 NOTE — ED Provider Notes (Signed)
[facility] Emergency Department Provider Note  ____________________________________________   I have reviewed the triage vital signs and the nursing notes.   HISTORY  Chief Complaint Emesis  and diarrhea   HPI Robin Myers is a 30 y.o. female who presents with nausea vomiting and diarrhea which she attributes to her daughter who has similar symptoms started yesterday. She denies abdominal pain besides vague cramping when vomiting. She has muscle aches but denies fever. She did get a flu shot this year. No recent travel.     Past Medical History  Diagnosis Date  . Sciatica   . Heart murmur   . Pre-eclampsia   . Preeclampsia   . Preeclampsia   . Preeclampsia     Patient Active Problem List   Diagnosis Date Noted  . Supervision of normal pregnancy in third trimester 04/10/2015  . Labor and delivery, indication for care 03/30/2015  . Flu-like symptoms 02/26/2015  . Headache 02/16/2015  . Indication for care in labor and delivery, antepartum 02/08/2015  . Major depression, recurrent (HCC) 12/19/2014  . Posttraumatic stress disorder 12/19/2014  . Preeclampsia     Past Surgical History  Procedure Laterality Date  . Hernia repair      Current Outpatient Rx  Name  Route  Sig  Dispense  Refill  . acetaminophen (TYLENOL) 325 MG tablet   Oral   Take 650 mg by mouth as needed.         Marland Kitchen albuterol (PROVENTIL HFA;VENTOLIN HFA) 108 (90 BASE) MCG/ACT inhaler   Inhalation   Inhale 2 puffs into the lungs every 4 (four) hours as needed for wheezing or shortness of breath.   1 Inhaler   2   . HYDROcodone-acetaminophen (NORCO) 5-325 MG tablet   Oral   Take 1 tablet by mouth every 6 (six) hours as needed for moderate pain.   15 tablet   0   . ibuprofen (ADVIL,MOTRIN) 600 MG tablet   Oral   Take 1 tablet (600 mg total) by mouth every 6 (six) hours.   30 tablet   0   . ondansetron (ZOFRAN) 4 MG tablet   Oral   Take 1 tablet (4 mg total) by  mouth daily as needed for nausea or vomiting.   20 tablet   1   . Prenatal Vit-DSS-Fe Fum-FA (PRENATAL 19) tablet   Oral   Take 1 tablet by mouth daily.           Allergies Latex  No family history on file.  Social History Social History  Substance Use Topics  . Smoking status: Light Tobacco Smoker  . Smokeless tobacco: Never Used  . Alcohol Use: No    Review of Systems  Constitutional: Negative for fever. Eyes: Negative for visual changes. ENT: Negative for sore throat Cardiovascular: Negative for chest pain. Respiratory: Negative for shortness of breath. Gastrointestinal: Positive for vomiting and diarrhea Genitourinary: Negative for dysuria. Musculoskeletal: Negative for back pain. Positive for muscle aches Skin: Negative for rash. Neurological: Negative for headaches or focal weakness Psychiatric: No anxiety    ____________________________________________   PHYSICAL EXAM:  VITAL SIGNS: ED Triage Vitals  Enc Vitals Group     BP 06/15/15 2023 122/77 mmHg     Pulse Rate 06/15/15 2023 130     Resp 06/15/15 2023 24     Temp 06/15/15 2023 99.3 F (37.4 C)     Temp Source 06/15/15 2023 Oral     SpO2 06/15/15 2023 98 %  Weight 06/15/15 2023 211 lb (95.709 kg)     Height 06/15/15 2023  (1.651 m)     Head Cir --      Peak Flow --      Pain Score 06/15/15 2025 10     Pain Loc --      Pain Edu? --      Excl. in GC? --      Constitutional: Alert and oriented. Well appearing and in no distress. Eyes: Conjunctivae are normal.  ENT   Head: Normocephalic and atraumatic.   Mouth/Throat: Mucous membranes are moist. Cardiovascular: Tachycardia, regular rhythm. Normal and symmetric distal pulses are present in all extremities. No murmurs, rubs, or gallops. Respiratory: Normal respiratory effort without tachypnea nor retractions. Breath sounds are clear and equal bilaterally.  Gastrointestinal: Soft and non-tender in all quadrants. No distention.  There is no CVA tenderness. Genitourinary: deferred Musculoskeletal: Nontender with normal range of motion in all extremities. No lower extremity tenderness nor edema. Neurologic:  Normal speech and language. No gross focal neurologic deficits are appreciated. Skin:  Skin is warm, dry and intact. No rash noted. Psychiatric: Mood and affect are normal. Patient exhibits appropriate insight and judgment.  ____________________________________________    LABS (pertinent positives/negatives)  Labs Reviewed  COMPREHENSIVE METABOLIC PANEL - Abnormal; Notable for the following:    Glucose, Bld 135 (*)    Creatinine, Ser 1.08 (*)    All other components within normal limits  CBC - Abnormal; Notable for the following:    MCV 77.3 (*)    MCH 24.8 (*)    RDW 18.3 (*)    All other components within normal limits  URINALYSIS COMPLETEWITH MICROSCOPIC ([facility] ONLY) - Abnormal; Notable for the following:    Color, Urine YELLOW (*)    APPearance CLEAR (*)    Bacteria, UA RARE (*)    Squamous Epithelial / LPF 0-5 (*)    All other components within normal limits  RAPID INFLUENZA A&B ANTIGENS ([facility] ONLY)  LIPASE, BLOOD  POC URINE PREG, ED  POCT PREGNANCY, URINE    ____________________________________________   EKG  None  ____________________________________________    RADIOLOGY I have personally reviewed any xrays that were ordered on this patient: Chest x-ray unremarkable  ____________________________________________   PROCEDURES  Procedure(s) performed: none  Critical Care performed: none  ____________________________________________   INITIAL IMPRESSION / ASSESSMENT AND PLAN / ED COURSE  Pertinent labs & imaging results that were available during my care of the patient were reviewed by me and considered in my medical decision making (see chart for details).  Patient presents with nausea vomiting diarrhea. Her abdominal exam is benign. Her labs are unremarkable. This is  consistent with viral gastroenteritis which is rampant in the community right now in fact her daughter who is being seen as well has similar complaints. We will give IV fluids and IV Zofran.  Patient is tolerating by mouth's after Zofran and reports feeling significantly better. Her heart rate has improved to 93. At this point I feel she is appropriate for discharge. We did discuss return precautions.  ____________________________________________   FINAL CLINICAL IMPRESSION(S) / ED DIAGNOSES  Final diagnoses:  Acute gastroenteritis     Jene Every, MD 06/15/15 2212

## 2015-06-15 NOTE — ED Notes (Signed)
Patient transported to X-ray 

## 2015-06-22 ENCOUNTER — Ambulatory Visit
Admission: RE | Admit: 2015-06-22 | Discharge: 2015-06-22 | Disposition: A | Discharge status: Home / Self Care | Payer: Medicaid Other | Source: Ambulatory Visit | Attending: Neurology | Admitting: Neurology

## 2015-06-22 DIAGNOSIS — R296 Repeated falls: Secondary | ICD-10-CM | POA: Insufficient documentation

## 2015-06-22 DIAGNOSIS — R531 Weakness: Secondary | ICD-10-CM | POA: Insufficient documentation

## 2015-06-22 DIAGNOSIS — M545 Low back pain: Secondary | ICD-10-CM | POA: Diagnosis present

## 2015-06-22 DIAGNOSIS — G8929 Other chronic pain: Secondary | ICD-10-CM | POA: Insufficient documentation

## 2015-06-22 DIAGNOSIS — J341 Cyst and mucocele of nose and nasal sinus: Secondary | ICD-10-CM | POA: Insufficient documentation

## 2015-06-22 DIAGNOSIS — R29898 Other symptoms and signs involving the musculoskeletal system: Secondary | ICD-10-CM

## 2015-09-17 DIAGNOSIS — Z8669 Personal history of other diseases of the nervous system and sense organs: Secondary | ICD-10-CM | POA: Insufficient documentation

## 2015-09-19 ENCOUNTER — Other Ambulatory Visit: Payer: Self-pay | Admitting: Neurology

## 2015-09-19 DIAGNOSIS — Z8669 Personal history of other diseases of the nervous system and sense organs: Secondary | ICD-10-CM

## 2016-05-19 NOTE — L&D Delivery Note (Signed)
Delivery Note Primary OB: [facility] Delivery Physician: Robin MajorPaul Michol Emory, Robin Myers Gestational Age: Full term Antepartum complications: none Intrapartum complications: None  A viable Female was delivered via vertex perentation.  Apgars:8 ,9  Weight:  7 lb 13 oz .   Placenta status: spontaneous and Intact.  Cord: 3+ vessels;  with the following complications: none.  Anesthesia:  none Episiotomy:  none Lacerations:  2nd Suture Repair: 2.0 Est. Blood Loss (mL):  less than 100 mL  Mom to postpartum.  Baby to Couplet care / Skin to Skin.  Robin MajorPaul Graycee Greeson, Robin Myers, Robin FrederickFACOG [facility], [facility] 12/27/2016  2:20 AM [phone]

## 2016-05-19 NOTE — L&D Delivery Note (Signed)
2240 Dr. Jean RosenthalJackson present to evaluate pt and discuss plan of care.  Dr. Jean RosenthalJackson explained to pt that things checked out well at this time and that he does not see any concerns.  Pt then stated that she thinks that she may have been leaking fluid because her pants have been wet.   2300 Sterile spec exam completed by Dr. Rosey BathJackson Nitrazine negative at this time.  Will continue to monitor.

## 2016-06-25 ENCOUNTER — Encounter: Payer: Self-pay | Admitting: Emergency Medicine

## 2016-06-25 DIAGNOSIS — J069 Acute upper respiratory infection, unspecified: Secondary | ICD-10-CM | POA: Diagnosis not present

## 2016-06-25 DIAGNOSIS — O99519 Diseases of the respiratory system complicating pregnancy, unspecified trimester: Secondary | ICD-10-CM | POA: Insufficient documentation

## 2016-06-25 DIAGNOSIS — R109 Unspecified abdominal pain: Secondary | ICD-10-CM | POA: Insufficient documentation

## 2016-06-25 DIAGNOSIS — O219 Vomiting of pregnancy, unspecified: Secondary | ICD-10-CM | POA: Diagnosis not present

## 2016-06-25 DIAGNOSIS — O99330 Smoking (tobacco) complicating pregnancy, unspecified trimester: Secondary | ICD-10-CM | POA: Diagnosis not present

## 2016-06-25 DIAGNOSIS — F17200 Nicotine dependence, unspecified, uncomplicated: Secondary | ICD-10-CM | POA: Insufficient documentation

## 2016-06-25 DIAGNOSIS — O26899 Other specified pregnancy related conditions, unspecified trimester: Secondary | ICD-10-CM | POA: Diagnosis not present

## 2016-06-25 DIAGNOSIS — Z79899 Other long term (current) drug therapy: Secondary | ICD-10-CM | POA: Diagnosis not present

## 2016-06-25 DIAGNOSIS — Z3A00 Weeks of gestation of pregnancy not specified: Secondary | ICD-10-CM | POA: Diagnosis not present

## 2016-06-25 DIAGNOSIS — M545 Low back pain: Secondary | ICD-10-CM | POA: Diagnosis not present

## 2016-06-25 DIAGNOSIS — Z91040 Latex allergy status: Secondary | ICD-10-CM | POA: Insufficient documentation

## 2016-06-25 LAB — HCG, QUANTITATIVE, PREGNANCY: hCG, Beta Chain, Quant, S: 82760 m[IU]/mL — ABNORMAL HIGH (ref <5)

## 2016-06-25 LAB — CBC
HEMATOCRIT: 36.5 % (ref 35.0–47.0)
HEMOGLOBIN: 12.1 g/dL (ref 12.0–16.0)
MCH: 26.8 pg (ref 26.0–34.0)
MCHC: 33.1 g/dL (ref 32.0–36.0)
MCV: 80.9 fL (ref 80.0–100.0)
Platelets: 226 10*3/uL (ref 150–440)
RBC: 4.51 MIL/uL (ref 3.80–5.20)
RDW: 15.3 % — ABNORMAL HIGH (ref 11.5–14.5)
WBC: 5.5 10*3/uL (ref 3.6–11.0)

## 2016-06-25 LAB — COMPREHENSIVE METABOLIC PANEL
ALBUMIN: 3.5 g/dL (ref 3.5–5.0)
ALT: 12 U/L — ABNORMAL LOW (ref 14–54)
ANION GAP: 7 (ref 5–15)
AST: 19 U/L (ref 15–41)
Alkaline Phosphatase: 81 U/L (ref 38–126)
BILIRUBIN TOTAL: 0.3 mg/dL (ref 0.3–1.2)
BUN: 11 mg/dL (ref 6–20)
CO2: 24 mmol/L (ref 22–32)
Calcium: 8.7 mg/dL — ABNORMAL LOW (ref 8.9–10.3)
Chloride: 104 mmol/L (ref 101–111)
Creatinine, Ser: 0.71 mg/dL (ref 0.44–1.00)
GFR calc Af Amer: >60 mL/min (ref >60)
GFR calc non Af Amer: >60 mL/min (ref >60)
GLUCOSE: 103 mg/dL — AB (ref 65–99)
POTASSIUM: 3.5 mmol/L (ref 3.5–5.1)
Sodium: 135 mmol/L (ref 135–145)
TOTAL PROTEIN: 7.1 g/dL (ref 6.5–8.1)

## 2016-06-25 LAB — LIPASE, BLOOD: Lipase: 28 U/L (ref 11–51)

## 2016-06-25 LAB — URINALYSIS, COMPLETE (UACMP) WITH MICROSCOPIC
BILIRUBIN URINE: NEGATIVE
Glucose, UA: NEGATIVE mg/dL
HGB URINE DIPSTICK: NEGATIVE
Ketones, ur: 5 mg/dL — AB
NITRITE: NEGATIVE
PROTEIN: 30 mg/dL — AB
Specific Gravity, Urine: 1.028 (ref 1.005–1.030)
pH: 6 (ref 5.0–8.0)

## 2016-06-25 LAB — POCT PREGNANCY, URINE: Preg Test, Ur: POSITIVE — AB

## 2016-06-25 NOTE — ED Triage Notes (Signed)
Pt ambulatory to triage in NAD, reports cough, n/v/d, and chills x 1 day.  Reports cough started yesterday.

## 2016-06-26 ENCOUNTER — Emergency Department
Admission: EM | Admit: 2016-06-26 | Discharge: 2016-06-26 | Disposition: A | Discharge status: Home / Self Care | Payer: Medicaid Other | Attending: Emergency Medicine | Admitting: Emergency Medicine

## 2016-06-26 DIAGNOSIS — J069 Acute upper respiratory infection, unspecified: Secondary | ICD-10-CM

## 2016-06-26 LAB — INFLUENZA PANEL BY PCR (TYPE A & B)
INFLAPCR: NEGATIVE
INFLBPCR: NEGATIVE

## 2016-06-26 MED ORDER — METOCLOPRAMIDE HCL 10 MG PO TABS: 10.0000 mg | ORAL_TABLET | Freq: Three times a day (TID) | ORAL | 0 refills | Status: DC | PRN

## 2016-06-26 MED ORDER — ACETAMINOPHEN 325 MG PO TABS
650.0000 mg | ORAL_TABLET | Freq: Once | ORAL | Status: AC
  Administered 2016-06-26: 650 mg via ORAL
  Filled 2016-06-26: qty 2

## 2016-06-26 NOTE — Discharge Instructions (Signed)
Please seek medical attention for any high fevers, chest pain, shortness of breath, change in behavior, persistent vomiting, bloody stool or any other new or concerning symptoms.  

## 2016-06-26 NOTE — ED Provider Notes (Signed)
[facility] Emergency Department Provider Note  ____________________________________________   I have reviewed the triage vital signs and the nursing notes.   HISTORY  Chief Complaint Generalized Body Aches; Emesis; and Cough   History limited by: Not Limited   HPI Robin Myers is a 31 y.o. female who presents to the emergency department today because of concerns for cough nausea, vomiting, body aches. Patient states that the symptoms started yesterday. They have been persistent today. She has had some abdominal discomfort with the nausea and vomiting. The patient states that her daughter was diagnosed with the flu yesterday.   Past Medical History:  Diagnosis Date  . Heart murmur   . Pre-eclampsia   . Preeclampsia   . Preeclampsia   . Preeclampsia   . Sciatica     Patient Active Problem List   Diagnosis Date Noted  . Supervision of normal pregnancy in third trimester 04/10/2015  . Labor and delivery, indication for care 03/30/2015  . Flu-like symptoms 02/26/2015  . Headache 02/16/2015  . Indication for care in labor and delivery, antepartum 02/08/2015  . Major depression, recurrent (HCC) 12/19/2014  . Posttraumatic stress disorder 12/19/2014  . Preeclampsia     Past Surgical History:  Procedure Laterality Date  . HERNIA REPAIR      Prior to Admission medications   Medication Sig Start Date End Date Taking? Authorizing Provider  acetaminophen (TYLENOL) 325 MG tablet Take 650 mg by mouth as needed.    Historical Provider, MD  albuterol (PROVENTIL HFA;VENTOLIN HFA) 108 (90 BASE) MCG/ACT inhaler Inhale 2 puffs into the lungs every 4 (four) hours as needed for wheezing or shortness of breath. 09/17/14   Irean Hong, MD  HYDROcodone-acetaminophen (NORCO) 5-325 MG tablet Take 1 tablet by mouth every 6 (six) hours as needed for moderate pain. 04/12/15   Conard Novak, MD  ibuprofen (ADVIL,MOTRIN) 600 MG tablet Take 1 tablet (600 mg total) by  mouth every 6 (six) hours. 04/12/15   Conard Novak, MD  ondansetron (ZOFRAN) 4 MG tablet Take 1 tablet (4 mg total) by mouth daily as needed for nausea or vomiting. 06/15/15   Jene Every, MD  Prenatal Vit-DSS-Fe Fum-FA (PRENATAL 19) tablet Take 1 tablet by mouth daily.    Historical Provider, MD    Allergies Latex  History reviewed. No pertinent family history.  Social History Social History  Substance Use Topics  . Smoking status: Light Tobacco Smoker  . Smokeless tobacco: Never Used  . Alcohol use No    Review of Systems  Constitutional: Negative for fever. Cardiovascular: Negative for chest pain. Respiratory: Positive for cough. Gastrointestinal: Positive for abdominal pain, nausea and vomiting. Genitourinary: Negative for dysuria. Musculoskeletal: Positive for lower back pain. Skin: Negative for rash. Neurological: Negative for headaches, focal weakness or numbness.  10-point ROS otherwise negative.  ____________________________________________   PHYSICAL EXAM:  VITAL SIGNS: ED Triage Vitals [06/25/16 2012]  Enc Vitals Group     BP 129/75     Pulse Rate (!) 111     Resp 20     Temp 98.2 F (36.8 C)     Temp Source Oral     SpO2 98 %     Weight 200 lb (90.7 kg)     Height 5\' 5"  (1.651 m)     Head Circumference      Peak Flow      Pain Score 9   Constitutional: Alert and oriented. Well appearing and in no distress. Eyes: Conjunctivae  are normal. Normal extraocular movements. ENT   Head: Normocephalic and atraumatic.   Nose: No congestion/rhinnorhea.   Mouth/Throat: Mucous membranes are moist.   Neck: No stridor. Hematological/Lymphatic/Immunilogical: No cervical lymphadenopathy. Cardiovascular: Normal rate, regular rhythm.  No murmurs, rubs, or gallops.  Respiratory: Normal respiratory effort without tachypnea nor retractions. Breath sounds are clear and equal bilaterally. No wheezes/rales/rhonchi. Gastrointestinal: Soft and non  tender. No rebound. No guarding.  Genitourinary: Deferred Musculoskeletal: Normal range of motion in all extremities. No lower extremity edema. Neurologic:  Normal speech and language. No gross focal neurologic deficits are appreciated.  Skin:  Skin is warm, dry and intact. No rash noted. Psychiatric: Mood and affect are normal. Speech and behavior are normal. Patient exhibits appropriate insight and judgment.  ____________________________________________    LABS (pertinent positives/negatives)  Labs Reviewed  COMPREHENSIVE METABOLIC PANEL - Abnormal; Notable for the following:       Result Value   Glucose, Bld 103 (*)    Calcium 8.7 (*)    ALT 12 (*)    All other components within normal limits  CBC - Abnormal; Notable for the following:    RDW 15.3 (*)    All other components within normal limits  URINALYSIS, COMPLETE (UACMP) WITH MICROSCOPIC - Abnormal; Notable for the following:    Color, Urine YELLOW (*)    APPearance CLEAR (*)    Ketones, ur 5 (*)    Protein, ur 30 (*)    Leukocytes, UA SMALL (*)    Bacteria, UA RARE (*)    Squamous Epithelial / LPF 0-5 (*)    All other components within normal limits  HCG, QUANTITATIVE, PREGNANCY - Abnormal; Notable for the following:    hCG, Beta Chain, Quant, S 82,760 (*)    All other components within normal limits  POCT PREGNANCY, URINE - Abnormal; Notable for the following:    Preg Test, Ur POSITIVE (*)    All other components within normal limits  LIPASE, BLOOD  INFLUENZA PANEL BY PCR (TYPE A & B)  POC URINE PREG, ED     ____________________________________________   EKG  None  ____________________________________________    RADIOLOGY  None  ____________________________________________   PROCEDURES  Procedures  ____________________________________________   INITIAL IMPRESSION / ASSESSMENT AND PLAN / ED COURSE  Pertinent labs & imaging results that were available during my care of the patient were  reviewed by me and considered in my medical decision making (see chart for details).  Patient presented to the emergency department today with concerns for cough, nausea, vomiting and body aches. Patient's influenza was negative. She was however positive for her pregnancy test. This point I think patient suffering from viral URI. No significant abdominal pain or tenderness on exam. No vaginal bleeding. This point I do not think patient has ectopic pregnancy do not think that emergent ultrasound is warranted at this time. Did discuss with patient importance of following up with OB/GYN and starting prenatal vitamins. Will give patient prescription for antiemetics given history of nausea and vomiting.  ____________________________________________   FINAL CLINICAL IMPRESSION(S) / ED DIAGNOSES  Final diagnoses:  Viral URI     Note: This dictation was prepared with Dragon dictation. Any transcriptional errors that result from this process are unintentional     Phineas SemenGraydon Monta Maiorana, MD 06/26/16 [phone]

## 2016-06-26 NOTE — ED Notes (Signed)
Pt denies vaginal bleeding. 

## 2016-07-02 ENCOUNTER — Emergency Department
Admission: EM | Admit: 2016-07-02 | Discharge: 2016-07-02 | Disposition: A | Discharge status: Home / Self Care | Payer: Medicaid Other | Attending: Emergency Medicine | Admitting: Emergency Medicine

## 2016-07-02 ENCOUNTER — Encounter: Payer: Self-pay | Admitting: *Deleted

## 2016-07-02 DIAGNOSIS — F17200 Nicotine dependence, unspecified, uncomplicated: Secondary | ICD-10-CM | POA: Diagnosis not present

## 2016-07-02 DIAGNOSIS — Z91040 Latex allergy status: Secondary | ICD-10-CM | POA: Insufficient documentation

## 2016-07-02 DIAGNOSIS — Z3A00 Weeks of gestation of pregnancy not specified: Secondary | ICD-10-CM | POA: Insufficient documentation

## 2016-07-02 DIAGNOSIS — L509 Urticaria, unspecified: Secondary | ICD-10-CM | POA: Diagnosis not present

## 2016-07-02 DIAGNOSIS — O99330 Smoking (tobacco) complicating pregnancy, unspecified trimester: Secondary | ICD-10-CM | POA: Insufficient documentation

## 2016-07-02 DIAGNOSIS — O99719 Diseases of the skin and subcutaneous tissue complicating pregnancy, unspecified trimester: Secondary | ICD-10-CM | POA: Diagnosis not present

## 2016-07-02 MED ORDER — TRIAMCINOLONE ACETONIDE 0.025 % EX OINT: 1.0000 "application " | TOPICAL_OINTMENT | Freq: Two times a day (BID) | CUTANEOUS | 0 refills | Status: AC

## 2016-07-02 MED ORDER — RANITIDINE HCL 300 MG PO TABS: 150.0000 mg | ORAL_TABLET | Freq: Every day | ORAL | 1 refills | Status: DC

## 2016-07-02 NOTE — ED Triage Notes (Signed)
Pt states she has hives on her arms and face, states she believes she is having an allergic reaction to something but unsure what, states she took benadryl last night, states she is pregnant but unsure of how far along

## 2016-07-02 NOTE — ED Provider Notes (Signed)
[facility] Emergency Department Provider Note  ____________________________________________  Time seen: Approximately 4:53 PM  I have reviewed the triage vital signs and the nursing notes.   HISTORY  Chief Complaint Urticaria   HPI Robin Myers is a 31 y.o. female presenting to the emergency department with hives localized to the skin overlying her arms, legs, abdomen and back for the past three days. Patient characterizes hives as pruritic. Patient has been taking Benadryl and applying hydrocortisone. Patient states that hives seem to be improving. Patient is in the emergency department for reassurance. She has been afebrile. She denies abdominal cramping, bleeding or abnormal discharge. Patient states that she is pregnant. However, she is unsure sure how far along she is. Patient states that her last menstrual cycle was in October or November. She has not established care with an OB/GYN. Patient denies chest pain, chest tightness, shortness of breath, abdominal pain, nausea and vomiting.   Past Medical History:  Diagnosis Date  . Heart murmur   . Pre-eclampsia   . Preeclampsia   . Preeclampsia   . Preeclampsia   . Sciatica     Patient Active Problem List   Diagnosis Date Noted  . Supervision of normal pregnancy in third trimester 04/10/2015  . Labor and delivery, indication for care 03/30/2015  . Flu-like symptoms 02/26/2015  . Headache 02/16/2015  . Indication for care in labor and delivery, antepartum 02/08/2015  . Major depression, recurrent (HCC) 12/19/2014  . Posttraumatic stress disorder 12/19/2014  . Preeclampsia     Past Surgical History:  Procedure Laterality Date  . HERNIA REPAIR      Prior to Admission medications   Medication Sig Start Date End Date Taking? Authorizing Provider  acetaminophen (TYLENOL) 325 MG tablet Take 650 mg by mouth as needed.    Historical Provider, MD  albuterol (PROVENTIL HFA;VENTOLIN HFA) 108 (90 BASE)  MCG/ACT inhaler Inhale 2 puffs into the lungs every 4 (four) hours as needed for wheezing or shortness of breath. 09/17/14   Irean HongJade J Sung, MD  HYDROcodone-acetaminophen (NORCO) 5-325 MG tablet Take 1 tablet by mouth every 6 (six) hours as needed for moderate pain. 04/12/15   Conard NovakStephen D Jackson, MD  ibuprofen (ADVIL,MOTRIN) 600 MG tablet Take 1 tablet (600 mg total) by mouth every 6 (six) hours. 04/12/15   Conard NovakStephen D Jackson, MD  metoCLOPramide (REGLAN) 10 MG tablet Take 1 tablet (10 mg total) by mouth every 8 (eight) hours as needed for nausea or vomiting. 06/26/16 06/26/17  Phineas SemenGraydon Goodman, MD  ondansetron (ZOFRAN) 4 MG tablet Take 1 tablet (4 mg total) by mouth daily as needed for nausea or vomiting. 06/15/15   Jene Everyobert Kinner, MD  Prenatal Vit-DSS-Fe Fum-FA (PRENATAL 19) tablet Take 1 tablet by mouth daily.    Historical Provider, MD  ranitidine (ZANTAC) 300 MG tablet Take 0.5 tablets (150 mg total) by mouth at bedtime. 07/02/16 07/09/16  Orvil FeilJaclyn M Tennelle Taflinger, PA-C  triamcinolone (KENALOG) 0.025 % ointment Apply 1 application topically 2 (two) times daily. 07/02/16 07/07/16  Orvil FeilJaclyn M Belal Scallon, PA-C    Allergies Latex  History reviewed. No pertinent family history.  Social History Social History  Substance Use Topics  . Smoking status: Light Tobacco Smoker  . Smokeless tobacco: Never Used  . Alcohol use No     Review of Systems  Constitutional: Patient is pregnant.  Eyes: No visual changes. No discharge ENT: No upper respiratory complaints. Cardiovascular: no chest pain. Respiratory: no cough. No SOB. Gastrointestinal: No abdominal pain.  No  nausea, no vomiting.  No diarrhea.  No constipation. Musculoskeletal: Negative for musculoskeletal pain. Skin: Patient has hives.  Neurological: Negative for headaches, focal weakness or numbness.  ____________________________________________   PHYSICAL EXAM:  VITAL SIGNS: ED Triage Vitals  Enc Vitals Group     BP 07/02/16 1335 136/82     Pulse Rate 07/02/16  1335 100     Resp 07/02/16 1335 18     Temp 07/02/16 1335 97.5 F (36.4 C)     Temp Source 07/02/16 1335 Oral     SpO2 07/02/16 1335 99 %     Weight 07/02/16 1336 200 lb (90.7 kg)     Height 07/02/16 1336 5\' 5"  (1.651 m)     Head Circumference --      Peak Flow --      Pain Score 07/02/16 1336 0     Pain Loc --      Pain Edu? --      Excl. in GC? --     Constitutional: Alert and oriented. Patient is talkative and engaged.  Eyes: Palpebral and bulbar conjunctiva are nonerythematous bilaterally. PERRL. EOMI. No scleral icterus bilaterally. Head: Atraumatic. ENT:      Ears: Tympanic membranes are pearly bilaterally. Bony landmarks are visualized bilaterally.       Nose: Skin overlying nares is without erythema. Nasal turbinates are non-erythematous. Nasal septum is midline.      Mouth/Throat: No angioedema. Airway is patent. Mucous membranes are moist.  Neck: Full range of motion. No pain with neck flexion. Hematological/Lymphatic/Immunilogical: No cervical lymphadenopathy.  Cardiovascular:  Normal rate, regular rhythm. Normal S1 and S2. No murmurs, gallops or rubs auscultated.  Respiratory:  On auscultation, adventitious sounds are absent.  Gastrointestinal:Abdomen is symmetric with striae. Active bowel sounds audible in all four quadrants. Musculature soft and relaxed to light palpation.   Skin:  Patient has residual hives of the skin overlying the arms, torso, back and legs. No excoriation marks can be visualized. No plaques are visualized. Psychiatric: Mood and affect are normal for age. Speech and behavior are normal.  ____________________________________________   LABS (all labs ordered are listed, but only abnormal results are displayed)  Labs Reviewed - No data to display ____________________________________________  EKG   ____________________________________________  RADIOLOGY   No results  found.  ____________________________________________    PROCEDURES  Procedure(s) performed:    Procedures    Medications - No data to display   ____________________________________________   INITIAL IMPRESSION / ASSESSMENT AND PLAN / ED COURSE  Pertinent labs & imaging results that were available during my care of the patient were reviewed by me and considered in my medical decision making (see chart for details).  Review of the [place] CSRS was performed in accordance of the [facility] prior to dispensing any controlled drugs.     Assessment and Plan: Hives Patient presents to the emergency department with hives. On physical exam, patient was not in respiratory distress. There were no signs of facial swelling. Her airway was patent. In reviewing patient's history, hives seem to be improving with hydrocortisone and Benadryl. Patient was advised to continue using Benadryl. Ranitidine and triamcinolone cream were prescribed at discharge. Patient education was provided regarding establishing care with OB/GYN immediately for appropriate prenatal care. Patient was advised to be taking prenatal vitamins as directed. Fetal heart tones were assessed by myself at 185 bpm. Physical exam and vital signs are reassuring at this time. All patient questions were answered.  ____________________________________________  FINAL CLINICAL IMPRESSION(S) / ED DIAGNOSES  Final diagnoses:  Urticaria      NEW MEDICATIONS STARTED DURING THIS VISIT:  New Prescriptions   RANITIDINE (ZANTAC) 300 MG TABLET    Take 0.5 tablets (150 mg total) by mouth at bedtime.   TRIAMCINOLONE (KENALOG) 0.025 % OINTMENT    Apply 1 application topically 2 (two) times daily.        This chart was dictated using voice recognition software/Dragon. Despite best efforts to proofread, errors can occur which can change the meaning. Any change was purely unintentional.    Orvil Feil, PA-C 07/02/16 1724    Governor Rooks, MD 07/02/16 2050

## 2016-07-02 NOTE — ED Notes (Signed)
Explained the wait    

## 2016-07-02 NOTE — ED Notes (Signed)
See triage note. Developed possible and itching to arms and face  No resp distress noted

## 2016-08-11 ENCOUNTER — Inpatient Hospital Stay: Payer: Medicaid Other

## 2016-08-11 ENCOUNTER — Observation Stay
Admission: EM | Admit: 2016-08-11 | Discharge: 2016-08-11 | Disposition: A | Discharge status: Home / Self Care | Payer: Medicaid Other | Attending: Advanced Practice Midwife | Admitting: Advanced Practice Midwife

## 2016-08-11 ENCOUNTER — Encounter: Payer: Self-pay | Admitting: *Deleted

## 2016-08-11 DIAGNOSIS — Z3A19 19 weeks gestation of pregnancy: Secondary | ICD-10-CM | POA: Insufficient documentation

## 2016-08-11 DIAGNOSIS — R1030 Lower abdominal pain, unspecified: Secondary | ICD-10-CM | POA: Diagnosis present

## 2016-08-11 DIAGNOSIS — O2342 Unspecified infection of urinary tract in pregnancy, second trimester: Secondary | ICD-10-CM

## 2016-08-11 DIAGNOSIS — O23592 Infection of other part of genital tract in pregnancy, second trimester: Secondary | ICD-10-CM | POA: Diagnosis not present

## 2016-08-11 DIAGNOSIS — B9689 Other specified bacterial agents as the cause of diseases classified elsewhere: Secondary | ICD-10-CM | POA: Insufficient documentation

## 2016-08-11 DIAGNOSIS — Z0489 Encounter for examination and observation for other specified reasons: Secondary | ICD-10-CM

## 2016-08-11 DIAGNOSIS — IMO0002 Reserved for concepts with insufficient information to code with codable children: Secondary | ICD-10-CM

## 2016-08-11 LAB — URINALYSIS, COMPLETE (UACMP) WITH MICROSCOPIC
Bacteria, UA: NONE SEEN
Bilirubin Urine: NEGATIVE
Glucose, UA: NEGATIVE mg/dL
Hgb urine dipstick: NEGATIVE
KETONES UR: NEGATIVE mg/dL
Nitrite: NEGATIVE
PH: 6 (ref 5.0–8.0)
Protein, ur: NEGATIVE mg/dL
SPECIFIC GRAVITY, URINE: 1.023 (ref 1.005–1.030)

## 2016-08-11 LAB — WET PREP, GENITAL
SPERM: NONE SEEN
TRICH WET PREP: NONE SEEN
Yeast Wet Prep HPF POC: NONE SEEN

## 2016-08-11 MED ORDER — METRONIDAZOLE 500 MG PO TABS: 500.0000 mg | ORAL_TABLET | Freq: Two times a day (BID) | ORAL | 0 refills | Status: AC

## 2016-08-11 MED ORDER — CEPHALEXIN 500 MG PO CAPS: 500.0000 mg | ORAL_CAPSULE | Freq: Two times a day (BID) | ORAL | 0 refills | Status: DC

## 2016-08-11 NOTE — OB Triage Note (Signed)
G4P3 presents at 7745w1d w/ c/o leaking fluid. Woke up at 0600 noticed her pants were wet. Describes a clear liquid without odor. Also c/o lower abdominal cramps and vaginal pain 8/10. Has had no prenatal care this pregnancy.

## 2016-08-11 NOTE — Discharge Summary (Signed)
Physician Final Progress Note  Patient ID: Robin Myers MRN: [id number] DOB/AGE: 10/19/1985 31 y.o.  Admit date: 08/11/2016 Admitting provider: Tresea Mall, CNM Discharge date: 08/11/2016   Admission Diagnoses: lower abdominal pain and vaginal irritation, fluid leaking  Discharge Diagnoses:  Active Problems:   Indication for care in labor and delivery, antepartum IUP with positive fetal heart tones, membranes intact, bacterial vaginosis, UTI  History of Present Illness: The patient is a 31 y.o. female [phone] who presents for complaints as stated above. She has not had any prenatal care yet this pregnancy and she is unsure of her LMP. Patient was admitted for observation and placed on monitors. Labs and dating/anatomy scan done.    Past Medical History:  Diagnosis Date  . Heart murmur   . Pre-eclampsia   . Preeclampsia   . Preeclampsia   . Preeclampsia   . Sciatica     Past Surgical History:  Procedure Laterality Date  . HERNIA REPAIR      No current facility-administered medications on file prior to encounter.    Current Outpatient Prescriptions on File Prior to Encounter  Medication Sig Dispense Refill  . acetaminophen (TYLENOL) 325 MG tablet Take 650 mg by mouth as needed.    Marland Kitchen albuterol (PROVENTIL HFA;VENTOLIN HFA) 108 (90 BASE) MCG/ACT inhaler Inhale 2 puffs into the lungs every 4 (four) hours as needed for wheezing or shortness of breath. (Patient not taking: Reported on 08/11/2016) 1 Inhaler 2  . HYDROcodone-acetaminophen (NORCO) 5-325 MG tablet Take 1 tablet by mouth every 6 (six) hours as needed for moderate pain. (Patient not taking: Reported on 08/11/2016) 15 tablet 0  . ibuprofen (ADVIL,MOTRIN) 600 MG tablet Take 1 tablet (600 mg total) by mouth every 6 (six) hours. (Patient not taking: Reported on 08/11/2016) 30 tablet 0  . metoCLOPramide (REGLAN) 10 MG tablet Take 1 tablet (10 mg total) by mouth every 8 (eight) hours as needed for nausea or vomiting. (Patient  not taking: Reported on 08/11/2016) 15 tablet 0  . ondansetron (ZOFRAN) 4 MG tablet Take 1 tablet (4 mg total) by mouth daily as needed for nausea or vomiting. (Patient not taking: Reported on 08/11/2016) 20 tablet 1  . Prenatal Vit-DSS-Fe Fum-FA (PRENATAL 19) tablet Take 1 tablet by mouth daily.    . ranitidine (ZANTAC) 300 MG tablet Take 0.5 tablets (150 mg total) by mouth at bedtime. 30 tablet 1    Allergies  Allergen Reactions  . Latex     Social History   Social History  . Marital status: Single    Spouse name: N/A  . Number of children: N/A  . Years of education: N/A   Occupational History  . Not on file.   Social History Main Topics  . Smoking status: Light Tobacco Smoker    Types: Cigarettes  . Smokeless tobacco: Never Used  . Alcohol use No  . Drug use: No  . Sexual activity: Yes    Birth control/ protection: None   Other Topics Concern  . Not on file   Social History Narrative  . No narrative on file    Physical Exam: LMP 03/27/2016   Gen: NAD CV: RRR Pulm: CTAB Pelvic: deferred Toco: negative Fetal Well Being: Heart Tones heard in 130's/140's by doppler and on U/S Ext: no evidence of DVT  Consults: None  Significant Findings/ Diagnostic Studies: labs:   Results for Robin Myers, Robin Myers (MRN [id number]) as of 08/11/2016 19:27  Ref. Range 08/11/2016 17:37 08/11/2016 17:53  Yeast Wet Prep HPF  POC Latest Ref Range: NONE SEEN   NONE SEEN  Trich, Wet Prep Latest Ref Range: NONE SEEN   NONE SEEN  Clue Cells Wet Prep HPF POC Latest Ref Range: NONE SEEN   PRESENT (A)  WBC, Wet Prep HPF POC Latest Ref Range: NONE SEEN   MANY (A)  Appearance Latest Ref Range: CLEAR   CLOUDY (A)  Bacteria, UA Latest Ref Range: NONE SEEN   NONE SEEN  Bilirubin Urine Latest Ref Range: NEGATIVE   NEGATIVE  Color, Urine Latest Ref Range: YELLOW   YELLOW (A)  Glucose Latest Ref Range: NEGATIVE mg/dL  NEGATIVE  Hgb urine dipstick Latest Ref Range: NEGATIVE   NEGATIVE  Ketones, ur Latest  Ref Range: NEGATIVE mg/dL  NEGATIVE  Leukocytes, UA Latest Ref Range: NEGATIVE   LARGE (A)  Mucous Unknown  PRESENT  Nitrite Latest Ref Range: NEGATIVE   NEGATIVE  pH Latest Ref Range: 5.0 - 8.0   6.0  Protein Latest Ref Range: NEGATIVE mg/dL  NEGATIVE  RBC / HPF Latest Ref Range: 0 - 5 RBC/hpf  6-30  Specific Gravity, Urine Latest Ref Range: 1.005 - 1.030   1.023  Squamous Epithelial / LPF Latest Ref Range: NONE SEEN   TOO NUMEROUS TO C... (A)  WBC, UA Latest Ref Range: 0 - 5 WBC/hpf  6-30  URINE CULTURE Unknown  Rpt  WET PREP, GENITAL Unknown  Rpt (A)  US OB COMP + 14 WK Unknown Rpt    Clue Cells seen on wet prep  Procedures: NST and dating/anatomy scan  Discharge Condition: good  Disposition: 01-Home or Self Care  Diet: Regular diet  Discharge Activity: Activity as tolerated  Discharge Instructions    Discharge activity:  No Restrictions    Complete by:  As directed    Discharge diet:  No restrictions    Complete by:  As directed    No sexual activity restrictions    Complete by:  As directed    Notify physician for a general feeling that "something is not right"    Complete by:  As directed    Notify physician for increase or change in vaginal discharge    Complete by:  As directed    Notify physician for intestinal cramps, with or without diarrhea, sometimes described as "gas pain"    Complete by:  As directed    Notify physician for leaking of fluid    Complete by:  As directed    Notify physician for low, dull backache, unrelieved by heat or Tylenol    Complete by:  As directed    Notify physician for menstrual like cramps    Complete by:  As directed    Notify physician for pelvic pressure    Complete by:  As directed    Notify physician for uterine contractions.  These may be painless and feel like the uterus is tightening or the baby is  "balling up"    Complete by:  As directed    Notify physician for vaginal bleeding    Complete by:  As directed     PRETERM LABOR:  Includes any of the follwing symptoms that occur between 20 - [redacted] weeks gestation.  If these symptoms are not stopped, preterm labor can result in preterm delivery, placing your baby at risk    Complete by:  As directed      Allergies as of 08/11/2016      Reactions   Latex       Medication List  STOP taking these medications   albuterol 108 (90 Base) MCG/ACT inhaler Commonly known as:  PROVENTIL HFA;VENTOLIN HFA   HYDROcodone-acetaminophen 5-325 MG tablet Commonly known as:  NORCO   ibuprofen 600 MG tablet Commonly known as:  ADVIL,MOTRIN   metoCLOPramide 10 MG tablet Commonly known as:  REGLAN   ondansetron 4 MG tablet Commonly known as:  ZOFRAN     TAKE these medications   acetaminophen 325 MG tablet Commonly known as:  TYLENOL Take 650 mg by mouth as needed.   cephALEXin 500 MG capsule Commonly known as:  KEFLEX Take 1 capsule (500 mg total) by mouth 2 (two) times daily.   metroNIDAZOLE 500 MG tablet Commonly known as:  FLAGYL Take 1 tablet (500 mg total) by mouth 2 (two) times daily.   PRENATAL 19 tablet Take 1 tablet by mouth daily.   ranitidine 300 MG tablet Commonly known as:  ZANTAC Take 0.5 tablets (150 mg total) by mouth at bedtime.      Follow-up Information    [facility] Department Follow up.   Why:  go to regular scheduled prenatal appointment Contact information: [address] [place] [address] [phone]           Total time spent taking care of this patient: 20 minutes  Signed: Tresea Mall, CNM  08/11/2016, 7:19 PM

## 2016-08-11 NOTE — Discharge Instructions (Signed)
Call provider or return to birthplace with:  1. Regular contractions 2. Leaking of fluid from your vagina 3. Vaginal bleeding: Bright red or heavy like a period 4. Decreased Fetal movement      Preventing Preterm Birth Preterm birth is when your baby is delivered between 20 weeks and 37 weeks of pregnancy. A full-term pregnancy lasts for at least 37 weeks. Preterm birth can be dangerous for your baby because the last few weeks of pregnancy are an important time for your baby's brain and lungs to grow. Many things can cause a baby to be born early. Sometimes the cause is not known. There are certain factors that make you more likely to experience preterm birth, such as:  Having a previous baby born preterm.  Being pregnant with twins or other multiples.  Having had fertility treatment.  Being overweight or underweight at the start of your pregnancy.  Having any of the following during pregnancy:  An infection, including a urinary tract infection (UTI) or an STI (sexually transmitted infection).  High blood pressure.  Diabetes.  Vaginal bleeding.  Being age 28 or older.  Being age 72 or younger.  Getting pregnant within 6 months of a previous pregnancy.  Suffering extreme stress or physical or emotional abuse during pregnancy.  Standing for long periods of time during pregnancy, such as working at a job that requires standing. What are the risks? The most serious risk of preterm birth is that the baby may not survive. This is more likely to happen if a baby is born before 34 weeks. Other risks and complications of preterm birth may include your baby having:  Breathing problems.  Brain damage that affects movement and coordination (cerebral palsy).  Feeding difficulties.  Vision or hearing problems.  Infections or inflammation of the digestive tract (colitis).  Developmental delays.  Learning disabilities.  Higher risk for diabetes, heart disease, and high blood  pressure later in life. What can I do to lower my risk? Medical care  The most important thing you can do to lower your risk for preterm birth is to get routine medical care during pregnancy (prenatal care). If you have a high risk of preterm birth, you may be referred to a health care provider who specializes in managing high-risk pregnancies (perinatologist). You may be given medicine to help prevent preterm birth. Lifestyle changes  Certain lifestyle changes can also lower your risk of preterm birth:  Wait at least 6 months after a pregnancy to become pregnant again.  Try to plan pregnancy for when you are between 70 and 92 years old.  Get to a healthy weight before getting pregnant. If you are overweight, work with your health care provider to safely lose weight.  Do not use any products that contain nicotine or tobacco, such as cigarettes and e-cigarettes. If you need help quitting, ask your health care provider.  Do not drink alcohol.  Do not use drugs. Where to find support: For more support, consider:  Talking with your health care provider.  Talking with a therapist or substance abuse counselor, if you need help quitting.  Working with a diet and nutrition specialist (dietitian) or a Systems analyst to maintain a healthy weight.  Joining a support group. Where to find more information: Learn more about preventing preterm birth from:  Centers for Disease Control and Prevention: http://curry.org/  March of Dimes: marchofdimes.org/complications/premature-babies.aspx  American Pregnancy Association: americanpregnancy.org/labor-and-birth/premature-labor Contact a health care provider if:  You have any of the following signs of  preterm labor before 37 weeks:  A change or increase in vaginal discharge.  Fluid leaking from your vagina.  Pressure or cramps in your lower abdomen.  A backache that does not go away or gets  worse.  Regular tightening (contractions) in your lower abdomen. Summary  Preterm birth means having your baby during weeks 20-37 of pregnancy.  Preterm birth may put your baby at risk for physical and mental problems.  Getting good prenatal care can help prevent preterm birth.  You can lower your risk of preterm birth by making certain lifestyle changes, such as not smoking and not using alcohol. This information is not intended to replace advice given to you by your health care provider. Make sure you discuss any questions you have with your health care provider. Document Released: 06/19/2015 Document Revised: 01/12/2016 Document Reviewed: 01/12/2016 Elsevier Interactive Patient Education  2017 [employer]

## 2016-08-12 LAB — URINE CULTURE

## 2016-08-28 ENCOUNTER — Encounter: Payer: Self-pay | Admitting: *Deleted

## 2016-08-28 ENCOUNTER — Emergency Department
Admission: EM | Admit: 2016-08-28 | Discharge: 2016-08-28 | Disposition: A | Discharge status: Home / Self Care | Payer: Medicare Other | Attending: Emergency Medicine | Admitting: Emergency Medicine

## 2016-08-28 DIAGNOSIS — Z79899 Other long term (current) drug therapy: Secondary | ICD-10-CM | POA: Insufficient documentation

## 2016-08-28 DIAGNOSIS — M5441 Lumbago with sciatica, right side: Secondary | ICD-10-CM | POA: Insufficient documentation

## 2016-08-28 DIAGNOSIS — O2310 Infections of bladder in pregnancy, unspecified trimester: Secondary | ICD-10-CM | POA: Insufficient documentation

## 2016-08-28 DIAGNOSIS — G8929 Other chronic pain: Secondary | ICD-10-CM | POA: Insufficient documentation

## 2016-08-28 DIAGNOSIS — F17210 Nicotine dependence, cigarettes, uncomplicated: Secondary | ICD-10-CM | POA: Insufficient documentation

## 2016-08-28 DIAGNOSIS — N3090 Cystitis, unspecified without hematuria: Secondary | ICD-10-CM

## 2016-08-28 DIAGNOSIS — Z3A19 19 weeks gestation of pregnancy: Secondary | ICD-10-CM | POA: Insufficient documentation

## 2016-08-28 DIAGNOSIS — R102 Pelvic and perineal pain: Secondary | ICD-10-CM | POA: Insufficient documentation

## 2016-08-28 LAB — URINALYSIS, COMPLETE (UACMP) WITH MICROSCOPIC
BILIRUBIN URINE: NEGATIVE
Glucose, UA: NEGATIVE mg/dL
HGB URINE DIPSTICK: NEGATIVE
Ketones, ur: NEGATIVE mg/dL
NITRITE: NEGATIVE
PROTEIN: 30 mg/dL — AB
Specific Gravity, Urine: 1.027 (ref 1.005–1.030)
pH: 7 (ref 5.0–8.0)

## 2016-08-28 LAB — CBC
HEMATOCRIT: 34.6 % — AB (ref 35.0–47.0)
Hemoglobin: 11.3 g/dL — ABNORMAL LOW (ref 12.0–16.0)
MCH: 26.9 pg (ref 26.0–34.0)
MCHC: 32.7 g/dL (ref 32.0–36.0)
MCV: 82.3 fL (ref 80.0–100.0)
Platelets: 258 10*3/uL (ref 150–440)
RBC: 4.2 MIL/uL (ref 3.80–5.20)
RDW: 15.2 % — ABNORMAL HIGH (ref 11.5–14.5)
WBC: 11.3 10*3/uL — ABNORMAL HIGH (ref 3.6–11.0)

## 2016-08-28 LAB — COMPREHENSIVE METABOLIC PANEL
ALBUMIN: 2.9 g/dL — AB (ref 3.5–5.0)
ALT: 8 U/L — AB (ref 14–54)
AST: 16 U/L (ref 15–41)
Alkaline Phosphatase: 86 U/L (ref 38–126)
Anion gap: 7 (ref 5–15)
BILIRUBIN TOTAL: 0.2 mg/dL — AB (ref 0.3–1.2)
BUN: 11 mg/dL (ref 6–20)
CO2: 21 mmol/L — ABNORMAL LOW (ref 22–32)
CREATININE: 0.62 mg/dL (ref 0.44–1.00)
Calcium: 8.5 mg/dL — ABNORMAL LOW (ref 8.9–10.3)
Chloride: 107 mmol/L (ref 101–111)
GFR calc Af Amer: >60 mL/min (ref >60)
GFR calc non Af Amer: >60 mL/min (ref >60)
GLUCOSE: 114 mg/dL — AB (ref 65–99)
POTASSIUM: 3.4 mmol/L — AB (ref 3.5–5.1)
Sodium: 135 mmol/L (ref 135–145)
TOTAL PROTEIN: 6.2 g/dL — AB (ref 6.5–8.1)

## 2016-08-28 LAB — LIPASE, BLOOD: Lipase: 27 U/L (ref 11–51)

## 2016-08-28 LAB — HCG, QUANTITATIVE, PREGNANCY: HCG, BETA CHAIN, QUANT, S: 12814 m[IU]/mL — AB (ref <5)

## 2016-08-28 MED ORDER — CYCLOBENZAPRINE HCL 10 MG PO TABS: 10.0000 mg | ORAL_TABLET | Freq: Three times a day (TID) | ORAL | 0 refills | Status: DC | PRN

## 2016-08-28 MED ORDER — NITROFURANTOIN MACROCRYSTAL 100 MG PO CAPS: 100.0000 mg | ORAL_CAPSULE | Freq: Two times a day (BID) | ORAL | 0 refills | Status: DC

## 2016-08-28 NOTE — ED Triage Notes (Signed)
Pt is pregnant.  Pt has low abd pain.  No vag bleeding.  No dysuria. Pt reports low back pain.  No n/v/d.  Pt alert.

## 2016-08-28 NOTE — ED Provider Notes (Signed)
[facility] Emergency Department Provider Note  ____________________________________________  Time seen: Approximately 9:10 PM  I have reviewed the triage vital signs and the nursing notes.   HISTORY  Chief Complaint Abdominal Pain    HPI Robin Myers is a 31 y.o. female who complains of low abdominal painfor the past few weeks. She was seen in the ED and diagnosed with urinary tract infection and given Keflex. However, symptoms have not improved. Denies fever or chills or vomiting. Denies flank pain.  She is also having worsening of her chronic low back pain bilaterally which includes right-sided sciatica. She took her last Flexeril today which did help, but she is requesting additional symptom control. She is [redacted] weeks pregnant by 19 week ultrasound dating. She is not yet had other prenatal care but has just established her insurance and is hoping to follow-up with [facility] as she did for her previous 2 pregnancies.  Denies vaginal bleeding or discharge, no leakage of fluid. No cramping contraction pain.     Past Medical History:  Diagnosis Date  . Heart murmur   . Pre-eclampsia   . Preeclampsia   . Preeclampsia   . Preeclampsia   . Sciatica      Patient Active Problem List   Diagnosis Date Noted  . Supervision of normal pregnancy in third trimester 04/10/2015  . Labor and delivery, indication for care 03/30/2015  . Flu-like symptoms 02/26/2015  . Headache 02/16/2015  . Indication for care in labor and delivery, antepartum 02/08/2015  . Major depression, recurrent (HCC) 12/19/2014  . Posttraumatic stress disorder 12/19/2014  . Preeclampsia      Past Surgical History:  Procedure Laterality Date  . HERNIA REPAIR       Prior to Admission medications   Medication Sig Start Date End Date Taking? Authorizing Provider  acetaminophen (TYLENOL) 325 MG tablet Take 650 mg by mouth as needed.    Historical Provider, MD  cephALEXin (KEFLEX) 500  MG capsule Take 1 capsule (500 mg total) by mouth 2 (two) times daily. 08/11/16   Tresea Mall, CNM  cyclobenzaprine (FLEXERIL) 10 MG tablet Take 1 tablet (10 mg total) by mouth 3 (three) times daily as needed for muscle spasms. 08/28/16   Sharman Cheek, MD  nitrofurantoin (MACRODANTIN) 100 MG capsule Take 1 capsule (100 mg total) by mouth 2 (two) times daily. 08/28/16   Sharman Cheek, MD  Prenatal Vit-DSS-Fe Fum-FA (PRENATAL 19) tablet Take 1 tablet by mouth daily.    Historical Provider, MD  ranitidine (ZANTAC) 300 MG tablet Take 0.5 tablets (150 mg total) by mouth at bedtime. 07/02/16 07/09/16  Orvil Feil, PA-C     Allergies Latex   No family history on file.  Social History Social History  Substance Use Topics  . Smoking status: Light Tobacco Smoker    Types: Cigarettes  . Smokeless tobacco: Never Used  . Alcohol use No    Review of Systems  Constitutional:   No fever or chills.  ENT:   No sore throat. No rhinorrhea. Cardiovascular:   No chest pain. Respiratory:   No dyspnea or cough. Gastrointestinal:   Positive lower abdominal pain as above without vomiting and diarrhea.  Genitourinary:   Negative for dysuria or difficulty urinating. Musculoskeletal:   Positive chronic back pain Neurological:   Negative for headaches 10-point ROS otherwise negative.  ____________________________________________   PHYSICAL EXAM:  VITAL SIGNS: ED Triage Vitals  Enc Vitals Group     BP 08/28/16 1841 135/88  Pulse Rate 08/28/16 1841 (!) 105     Resp 08/28/16 1841 18     Temp 08/28/16 1841 98.2 F (36.8 C)     Temp Source 08/28/16 1841 Oral     SpO2 08/28/16 1841 100 %     Weight 08/28/16 1842 200 lb (90.7 kg)     Height 08/28/16 1842  (1.651 m)     Head Circumference --      Peak Flow --      Pain Score 08/28/16 1843 9     Pain Loc --      Pain Edu? --      Excl. in GC? --     Vital signs reviewed, nursing assessments reviewed.   Constitutional:   Alert  and oriented. Well appearing and in no distress. Eyes:   No scleral icterus. No conjunctival pallor. PERRL. EOMI.  No nystagmus. ENT   Head:   Normocephalic and atraumatic.   Nose:   No congestion/rhinnorhea. No septal hematoma   Mouth/Throat:   MMM, no pharyngeal erythema. No peritonsillar mass.    Neck:   No stridor. No SubQ emphysema. No meningismus. Hematological/Lymphatic/Immunilogical:   No cervical lymphadenopathy. Cardiovascular:   RRR. Symmetric bilateral radial and DP pulses.  No murmurs.  Respiratory:   Normal respiratory effort without tachypnea nor retractions. Breath sounds are clear and equal bilaterally. No wheezes/rales/rhonchi. Gastrointestinal:   Soft With suprapubic tenderness. Non distended. There is no CVA tenderness.  No rebound, rigidity, or guarding. Genitourinary:   deferred Musculoskeletal:   Normal range of motion in all extremities. No joint effusions.  No lower extremity tenderness.  No edema. No midline spinal tenderness. Diffuse low back tenderness in the musculature. Pain worsened by change in position. Neurologic:   Normal speech and language.  CN 2-10 normal. Motor grossly intact. No gross focal neurologic deficits are appreciated.  Skin:    Skin is warm, dry and intact. No rash noted.  No petechiae, purpura, or bullae.  ____________________________________________    LABS (pertinent positives/negatives) (all labs ordered are listed, but only abnormal results are displayed) Labs Reviewed  COMPREHENSIVE METABOLIC PANEL - Abnormal; Notable for the following:       Result Value   Potassium 3.4 (*)    CO2 21 (*)    Glucose, Bld 114 (*)    Calcium 8.5 (*)    Total Protein 6.2 (*)    Albumin 2.9 (*)    ALT 8 (*)    Total Bilirubin 0.2 (*)    All other components within normal limits  CBC - Abnormal; Notable for the following:    WBC 11.3 (*)    Hemoglobin 11.3 (*)    HCT 34.6 (*)    RDW 15.2 (*)    All other components within normal  limits  URINALYSIS, COMPLETE (UACMP) WITH MICROSCOPIC - Abnormal; Notable for the following:    Color, Urine YELLOW (*)    APPearance HAZY (*)    Protein, ur 30 (*)    Leukocytes, UA MODERATE (*)    Bacteria, UA RARE (*)    Squamous Epithelial / LPF 6-30 (*)    All other components within normal limits  HCG, QUANTITATIVE, PREGNANCY - Abnormal; Notable for the following:    hCG, Beta Chain, Quant, S 12,814 (*)    All other components within normal limits  URINE CULTURE  LIPASE, BLOOD   ____________________________________________   EKG    ____________________________________________    RADIOLOGY  No results found.  ____________________________________________  PROCEDURES Procedures  ____________________________________________   INITIAL IMPRESSION / ASSESSMENT AND PLAN / ED COURSE  Pertinent labs & imaging results that were available during my care of the patient were reviewed by me and considered in my medical decision making (see chart for details).  Patient well appearing no acute distress, presents for reevaluation of pelvic pain. Again urinalysis is consistent with urinary tract infection. Previous urine culture was nondiagnostic due to multiple species from inadequate clean catch. Repeat urine culture today. Prescription for one week of Macrobid. She reports in the past she took Flexeril and gabapentin for similar pain during pregnancy. On reviewing pregnancy risk categories these medications, Flexeril is acceptable and I'll refill his medication. Gabapentin appears to have unacceptable or not completely understood risk, so I'm advising against this medication at this time. Have her follow up with primary care and obstetrics.   Otherwise her examination is reassuring and her abdomen is benign. Low suspicion for STI PID TOA torsion. Low suspicion for diverticulitis perforation obstruction and intra-abdominal abscess appendicitis or biliary disease.  I reviewed her  ultrasound from 2 weeks ago, noting that it is essentially normal.       ____________________________________________   FINAL CLINICAL IMPRESSION(S) / ED DIAGNOSES  Final diagnoses:  Cystitis  Chronic bilateral low back pain with right-sided sciatica      New Prescriptions   CYCLOBENZAPRINE (FLEXERIL) 10 MG TABLET    Take 1 tablet (10 mg total) by mouth 3 (three) times daily as needed for muscle spasms.   NITROFURANTOIN (MACRODANTIN) 100 MG CAPSULE    Take 1 capsule (100 mg total) by mouth 2 (two) times daily.     Portions of this note were generated with dragon dictation software. Dictation errors may occur despite best attempts at proofreading.    Sharman Cheek, MD 08/28/16 2116

## 2016-08-30 LAB — URINE CULTURE

## 2016-09-08 LAB — OB RESULTS CONSOLE HIV ANTIBODY (ROUTINE TESTING): HIV: NONREACTIVE

## 2016-09-09 LAB — OB RESULTS CONSOLE RPR: RPR: NONREACTIVE

## 2016-09-09 LAB — OB RESULTS CONSOLE HEPATITIS B SURFACE ANTIGEN: Hepatitis B Surface Ag: NEGATIVE

## 2016-09-09 LAB — OB RESULTS CONSOLE VARICELLA ZOSTER ANTIBODY, IGG: VARICELLA IGG: IMMUNE

## 2016-09-09 LAB — OB RESULTS CONSOLE RUBELLA ANTIBODY, IGM: Rubella: IMMUNE

## 2016-09-11 ENCOUNTER — Other Ambulatory Visit: Payer: Self-pay | Admitting: Nurse Practitioner

## 2016-09-11 ENCOUNTER — Emergency Department
Admission: EM | Admit: 2016-09-11 | Discharge: 2016-09-11 | Disposition: A | Discharge status: Home / Self Care | Payer: Medicare Other | Attending: Emergency Medicine | Admitting: Emergency Medicine

## 2016-09-11 ENCOUNTER — Observation Stay
Admission: EM | Admit: 2016-09-11 | Discharge: 2016-09-11 | Disposition: A | Discharge status: Home / Self Care | Payer: Medicare Other | Attending: Obstetrics & Gynecology | Admitting: Obstetrics & Gynecology

## 2016-09-11 ENCOUNTER — Encounter: Payer: Self-pay | Admitting: Medical Oncology

## 2016-09-11 DIAGNOSIS — J069 Acute upper respiratory infection, unspecified: Secondary | ICD-10-CM | POA: Diagnosis not present

## 2016-09-11 DIAGNOSIS — R05 Cough: Secondary | ICD-10-CM | POA: Diagnosis present

## 2016-09-11 DIAGNOSIS — R109 Unspecified abdominal pain: Secondary | ICD-10-CM

## 2016-09-11 DIAGNOSIS — F17210 Nicotine dependence, cigarettes, uncomplicated: Secondary | ICD-10-CM | POA: Insufficient documentation

## 2016-09-11 DIAGNOSIS — O26892 Other specified pregnancy related conditions, second trimester: Secondary | ICD-10-CM | POA: Diagnosis present

## 2016-09-11 DIAGNOSIS — N3000 Acute cystitis without hematuria: Secondary | ICD-10-CM | POA: Diagnosis not present

## 2016-09-11 DIAGNOSIS — N390 Urinary tract infection, site not specified: Secondary | ICD-10-CM | POA: Diagnosis not present

## 2016-09-11 LAB — URINALYSIS, COMPLETE (UACMP) WITH MICROSCOPIC
Bilirubin Urine: NEGATIVE
Glucose, UA: NEGATIVE mg/dL
Hgb urine dipstick: NEGATIVE
KETONES UR: NEGATIVE mg/dL
Nitrite: NEGATIVE
PH: 6 (ref 5.0–8.0)
Protein, ur: NEGATIVE mg/dL
SPECIFIC GRAVITY, URINE: 1.021 (ref 1.005–1.030)

## 2016-09-11 MED ORDER — AMOXICILLIN-POT CLAVULANATE 500-125 MG PO TABS: 1.0000 | ORAL_TABLET | Freq: Two times a day (BID) | ORAL | 0 refills | Status: DC

## 2016-09-11 MED ORDER — ALBUTEROL SULFATE HFA 108 (90 BASE) MCG/ACT IN AERS: 2.0000 | INHALATION_SPRAY | Freq: Four times a day (QID) | RESPIRATORY_TRACT | 0 refills | Status: DC | PRN

## 2016-09-11 NOTE — ED Notes (Signed)
Pt informed provider that she was now having some abd cramping   No bleeding  L&D notified of patient

## 2016-09-11 NOTE — Discharge Summary (Signed)
  See FPN 

## 2016-09-11 NOTE — ED Triage Notes (Signed)
Pt reports nasal congestion, cough, and body aches x 2 days.

## 2016-09-11 NOTE — ED Provider Notes (Signed)
[facility] Emergency Department Provider Note  ____________________________________________  Time seen: Approximately 8:06 AM  I have reviewed the triage vital signs and the nursing notes.   HISTORY  Chief Complaint Generalized Body Aches; Nasal Congestion; and Cough    HPI Robin Myers is a 31 y.o. female that presents to the emergency department with 2 days of chills, body aches, congestion, and non productive cough. She is also still having frequency and urgency of urination. She was seen in the emergency department for abdominal pain 2 weeks ago and was given Flexeril. She took a Flexeril for her muscle aches, which has helped. Patient smokes one cigar per week. Patient is [redacted] weeks pregnant. She has had 3 pregnancies previously and carried them until 36 weeks term. Patient has not had any prenatal care. She went to the health department one month ago and was told that she was too high risk pregnancy to be seen there. She denies shortness of breath, chest pain, nausea, vomiting, abdominal pain, dysuria.   Past Medical History:  Diagnosis Date  . Heart murmur   . Pre-eclampsia   . Preeclampsia   . Preeclampsia   . Preeclampsia   . Sciatica     Patient Active Problem List   Diagnosis Date Noted  . Indication for care in labor or delivery 09/11/2016  . Supervision of normal pregnancy in third trimester 04/10/2015  . Labor and delivery, indication for care 03/30/2015  . Flu-like symptoms 02/26/2015  . Headache 02/16/2015  . Indication for care in labor and delivery, antepartum 02/08/2015  . Major depression, recurrent (HCC) 12/19/2014  . Posttraumatic stress disorder 12/19/2014  . Preeclampsia     Past Surgical History:  Procedure Laterality Date  . HERNIA REPAIR      Prior to Admission medications   Medication Sig Start Date End Date Taking? Authorizing Provider  acetaminophen (TYLENOL) 325 MG tablet Take 650 mg by mouth as needed.     Historical Provider, MD  albuterol (PROVENTIL HFA;VENTOLIN HFA) 108 (90 Base) MCG/ACT inhaler Inhale 2 puffs into the lungs every 6 (six) hours as needed for wheezing or shortness of breath. 09/11/16   Enid Derry, PA-C  amoxicillin-clavulanate (AUGMENTIN) 500-125 MG tablet Take 1 tablet (500 mg total) by mouth 2 (two) times daily. 09/11/16   Enid Derry, PA-C  cephALEXin (KEFLEX) 500 MG capsule Take 1 capsule (500 mg total) by mouth 2 (two) times daily. Patient not taking: Reported on 09/11/2016 08/11/16   Tresea Mall, CNM  cyclobenzaprine (FLEXERIL) 10 MG tablet Take 1 tablet (10 mg total) by mouth 3 (three) times daily as needed for muscle spasms. 08/28/16   Sharman Cheek, MD  nitrofurantoin (MACRODANTIN) 100 MG capsule Take 1 capsule (100 mg total) by mouth 2 (two) times daily. Patient not taking: Reported on 09/11/2016 08/28/16   Sharman Cheek, MD  Prenatal Vit-DSS-Fe Fum-FA (PRENATAL 19) tablet Take 1 tablet by mouth daily.    Historical Provider, MD  ranitidine (ZANTAC) 300 MG tablet Take 0.5 tablets (150 mg total) by mouth at bedtime. 07/02/16 07/09/16  Orvil Feil, PA-C    Allergies Latex  No family history on file.  Social History Social History  Substance Use Topics  . Smoking status: Light Tobacco Smoker    Types: Cigarettes  . Smokeless tobacco: Never Used  . Alcohol use No     Review of Systems  Constitutional: No fever/chills Eyes: No visual changes. No discharge. ENT: Positive for congestion and rhinorrhea. Cardiovascular: No chest pain. Respiratory:  Positive for cough. No SOB. Gastrointestinal: No abdominal pain.  No nausea, no vomiting.  No diarrhea.  No constipation. Musculoskeletal: Positive for musculoskeletal pain. Skin: Negative for rash, abrasions, lacerations, ecchymosis. Neurological: Negative for headaches.   ____________________________________________   PHYSICAL EXAM:  VITAL SIGNS: ED Triage Vitals  Enc Vitals Group     BP 09/11/16  0740 (!) 137/91     Pulse Rate 09/11/16 0740 83     Resp 09/11/16 0740 18     Temp 09/11/16 0740 98.4 F (36.9 C)     Temp Source 09/11/16 0740 Oral     SpO2 09/11/16 0740 100 %     Weight 09/11/16 0739 200 lb (90.7 kg)     Height 09/11/16 0739  (1.651 m)     Head Circumference --      Peak Flow --      Pain Score 09/11/16 0739 9     Pain Loc --      Pain Edu? --      Excl. in GC? --     Eyes: Conjunctivae are normal. PERRL. EOMI. No discharge. Head: Atraumatic. ENT: No frontal and maxillary sinus tenderness.      Ears: Tympanic membranes pearly gray with good landmarks. No discharge.      Nose: Mild congestion/rhinnorhea.      Mouth/Throat: Mucous membranes are moist. Oropharynx non-erythematous. Tonsils not enlarged. No exudates. Uvula midline. Neck: No stridor.   Hematological/Lymphatic/Immunilogical: No cervical lymphadenopathy. Cardiovascular: Normal rate, regular rhythm.  Good peripheral circulation. Respiratory: Normal respiratory effort without tachypnea or retractions. Lungs CTAB. Good air entry to the bases with no decreased or absent breath sounds. Gastrointestinal: Bowel sounds 4 quadrants. Soft and nontender to palpation. No guarding or rigidity. No palpable masses. No distention. Musculoskeletal: Full range of motion to all extremities. No gross deformities appreciated. Neurologic:  Normal speech and language. No gross focal neurologic deficits are appreciated.  Skin:  Skin is warm, dry and intact. No rash noted.   ____________________________________________   LABS (all labs ordered are listed, but only abnormal results are displayed)  Labs Reviewed  URINALYSIS, COMPLETE (UACMP) WITH MICROSCOPIC - Abnormal; Notable for the following:       Result Value   Color, Urine YELLOW (*)    APPearance HAZY (*)    Leukocytes, UA SMALL (*)    Bacteria, UA RARE (*)    Squamous Epithelial / LPF 0-5 (*)    All other components within normal limits    ____________________________________________  EKG   ____________________________________________  RADIOLOGY  No results found.  ____________________________________________    PROCEDURES  Procedure(s) performed:    Procedures    Medications - No data to display   ____________________________________________   INITIAL IMPRESSION / ASSESSMENT AND PLAN / ED COURSE  Pertinent labs & imaging results that were available during my care of the patient were reviewed by me and considered in my medical decision making (see chart for details).  Review of the [place] CSRS was performed in accordance of the [facility] prior to dispensing any controlled drugs.     Patient's diagnosis is consistent with upper respiratory tract infection and urinary tract infection. Vital signs and exam are reassuring.  Upper respiratory tract infection is likely viral. Symptoms and urinalysis consistent with urinary tract infection. Patient has already been on a course of Keflex and Macrobid. Patient will be discharged home with prescriptions for Augmentin and albuterol inhaler. Fetal heart tones assessed at 158 bpm. Patient began having abdominal cramping while in  emergency room so labor and delivery was notified and patient will be sent for observation.     ____________________________________________  FINAL CLINICAL IMPRESSION(S) / ED DIAGNOSES  Final diagnoses:  Abdominal cramping  Acute cystitis without hematuria  Viral upper respiratory tract infection      NEW MEDICATIONS STARTED DURING THIS VISIT:  Discharge Medication List as of 09/11/2016 10:20 AM    START taking these medications   Details  amoxicillin-clavulanate (AUGMENTIN) 500-125 MG tablet Take 1 tablet (500 mg total) by mouth 2 (two) times daily., Starting Thu 09/11/2016, Print            This chart was dictated using voice recognition software/Dragon. Despite best efforts to proofread, errors can occur which can change  the meaning. Any change was purely unintentional.    Enid Derry, PA-C 09/11/16 1323    Emily Filbert, MD 09/11/16 1414

## 2016-09-11 NOTE — ED Notes (Signed)
Watching TV   Awaiting re-eval

## 2016-09-11 NOTE — Final Progress Note (Signed)
Physician Final Progress Note  Patient ID: Robin Myers MRN: [id number] DOB/AGE: 1985/08/04 31 y.o.  Admit date: 09/11/2016 Admitting provider: Christeen Douglas, MD Discharge date: 09/11/2016   Admission Diagnoses: URI, UTI  Discharge Diagnoses: same  Consults: None and seen in ER for URI and UTI reasons.  Pt seen on L&D for FHT and cramping assessment  Significant Findings/ Diagnostic Studies: 31 yo G4P3 at 23 weeks with cramping as well as respiratory sx's.  No ctxs or VB or VD.  PNC at [facility].  No concerning signs reported today, other than tratements started and prescribed by ER physician.  Procedures: None  Discharge Condition: good  Disposition: 01-Home or Self Care  Diet: Regular diet  Discharge Activity: Activity as tolerated   Allergies as of 09/11/2016      Reactions   Latex       Medication List    TAKE these medications   acetaminophen 325 MG tablet Commonly known as:  TYLENOL Take 650 mg by mouth as needed.   albuterol 108 (90 Base) MCG/ACT inhaler Commonly known as:  PROVENTIL HFA;VENTOLIN HFA Inhale 2 puffs into the lungs every 6 (six) hours as needed for wheezing or shortness of breath.   amoxicillin-clavulanate 500-125 MG tablet Commonly known as:  AUGMENTIN Take 1 tablet (500 mg total) by mouth 2 (two) times daily.   cephALEXin 500 MG capsule Commonly known as:  KEFLEX Take 1 capsule (500 mg total) by mouth 2 (two) times daily.   cyclobenzaprine 10 MG tablet Commonly known as:  FLEXERIL Take 1 tablet (10 mg total) by mouth 3 (three) times daily as needed for muscle spasms.   nitrofurantoin 100 MG capsule Commonly known as:  MACRODANTIN Take 1 capsule (100 mg total) by mouth 2 (two) times daily.   PRENATAL 19 tablet Take 1 tablet by mouth daily.   ranitidine 300 MG tablet Commonly known as:  ZANTAC Take 0.5 tablets (150 mg total) by mouth at bedtime.      Follow-up Information    [facility]. Call.   Why:  this  afternoon for follow up appointment Contact information: [address] [place] [address] [phone]           Total time spent taking care of this patient: TRIAGE Patient presented for evaluation of labor.  Patient had cervical exam by RN and this was reported to me. I reviewed her vital signs and fetal tracing, both of which were reassuring.  Patient was discharge as she was not laboring.   Signed: Letitia Libra 09/11/2016, 12:52 PM

## 2016-09-11 NOTE — OB Triage Note (Signed)
Pt seen in ER for respiratory issues and began to have abd. Cramping so sent here for monitoring

## 2016-09-11 NOTE — ED Notes (Signed)
See triage note  States she developed cough with nasal congestion couple of days ago  Pt also about 23 weeks preg  Denies any concerns with her pregnancy.. Afebrile on arrival

## 2016-09-16 ENCOUNTER — Other Ambulatory Visit: Payer: Self-pay | Admitting: Nurse Practitioner

## 2016-09-18 ENCOUNTER — Ambulatory Visit: Payer: Self-pay

## 2016-10-02 LAB — OB RESULTS CONSOLE RPR: RPR: NONREACTIVE

## 2016-10-22 ENCOUNTER — Other Ambulatory Visit (HOSPITAL_COMMUNITY): Payer: Self-pay | Admitting: Family Medicine

## 2016-10-22 DIAGNOSIS — R011 Cardiac murmur, unspecified: Secondary | ICD-10-CM

## 2016-10-27 ENCOUNTER — Ambulatory Visit
Admission: RE | Admit: 2016-10-27 | Discharge: 2016-10-27 | Disposition: A | Discharge status: Home / Self Care | Payer: Medicare Other | Source: Ambulatory Visit | Attending: Family Medicine | Admitting: Family Medicine

## 2016-10-27 DIAGNOSIS — R011 Cardiac murmur, unspecified: Secondary | ICD-10-CM | POA: Insufficient documentation

## 2016-10-27 NOTE — Progress Notes (Signed)
*  PRELIMINARY RESULTS* Echocardiogram 2D Echocardiogram has been performed.  Cristela BlueHege, Jaimya Feliciano 10/27/2016, 11:45 AM

## 2016-11-06 ENCOUNTER — Encounter: Payer: Self-pay | Admitting: Obstetrics and Gynecology

## 2016-11-06 ENCOUNTER — Observation Stay
Admission: EM | Admit: 2016-11-06 | Discharge: 2016-11-06 | Discharge status: Against Medical Advice | Payer: Medicare Other | Attending: Obstetrics and Gynecology | Admitting: Obstetrics and Gynecology

## 2016-11-06 DIAGNOSIS — W010XXA Fall on same level from slipping, tripping and stumbling without subsequent striking against object, initial encounter: Secondary | ICD-10-CM | POA: Diagnosis not present

## 2016-11-06 DIAGNOSIS — O9A213 Injury, poisoning and certain other consequences of external causes complicating pregnancy, third trimester: Secondary | ICD-10-CM | POA: Diagnosis not present

## 2016-11-06 DIAGNOSIS — Z3A31 31 weeks gestation of pregnancy: Secondary | ICD-10-CM | POA: Insufficient documentation

## 2016-11-06 DIAGNOSIS — S3991XA Unspecified injury of abdomen, initial encounter: Secondary | ICD-10-CM | POA: Insufficient documentation

## 2016-11-06 DIAGNOSIS — Z5321 Procedure and treatment not carried out due to patient leaving prior to being seen by health care provider: Secondary | ICD-10-CM | POA: Insufficient documentation

## 2016-11-06 DIAGNOSIS — O9989 Other specified diseases and conditions complicating pregnancy, childbirth and the puerperium: Secondary | ICD-10-CM | POA: Diagnosis not present

## 2016-11-06 HISTORY — DX: Gestational (pregnancy-induced) hypertension without significant proteinuria, unspecified trimester: O13.9

## 2016-11-06 HISTORY — DX: Unspecified abnormal cytological findings in specimens from vagina: R87.629

## 2016-11-06 HISTORY — DX: Unspecified asthma, uncomplicated: J45.909

## 2016-11-06 HISTORY — DX: Cardiac arrhythmia, unspecified: I49.9

## 2016-11-06 MED ORDER — ACETAMINOPHEN 325 MG PO TABS: 650.0000 mg | ORAL_TABLET | ORAL | Status: DC | PRN

## 2016-11-06 NOTE — Progress Notes (Signed)
Reviewed plan of care established by Dr. Bonney AidStaebler for prolonged monitoring for four hours at 0130. Pt expressed concern for childcare availability for children at home. Pt stated she did not feel she could stay for the recommended time. At 0240, pt called RN to OBs room and stated she needed to leave. Pt advised this was against the medical advice of Dr. Bonney AidStaebler, pt acknowledged but had no other option due to childcare. AMA papers reviewed and signed. Pt advised to return to hospital with vaginal bleeding, abdominal pain, decreased fetal movement, or leakage of fluid. Pt verbalized understanding.

## 2016-11-06 NOTE — OB Triage Note (Signed)
Pt presents to L&D s/p fall with impact to front abdomen at 2355 on 6/20. Pt experienced cramping in abdomen shortly after fall. Pt reports good fetal movement, no bleeding or leaking fluid. EFM and toco applied and explained. Plan to monitor fetal and maternal well-being.

## 2016-11-06 NOTE — Final Progress Note (Signed)
Physician Final Progress Note  Patient ID: Robin Myers MRN: [id number] DOB/AGE: 30/07/1985 30 y.o.  Admit date: 11/06/2016 Admitting provider: Vena AustriaAndreas Osias Resnick, MD Discharge date: 11/06/2016   Admission Diagnoses: Abdominal trauma  Discharge Diagnoses: Abdominal trauma  31 yo G4P1203 at 1559w6d presenting after ground level fall with direct abdominal trauma.  Recommendation had been for 4-hr of fetal fetal monitoring but patient left AMA secondary to child care issues.    Consults: None  Significant Findings/ Diagnostic Studies: none  Procedures: NST Baseline: 130 Variability: moderate Accelerations: present Decelerations: absent Tocometry: irregular  Discharge Condition: good  Disposition: 01-Home or Self Care  Diet: Regular diet  Discharge Activity: Activity as tolerated  Discharge Instructions    Discharge activity:  No Restrictions    Complete by:  As directed    Discharge diet:  No restrictions    Complete by:  As directed    No sexual activity restrictions    Complete by:  As directed    Notify physician for a general feeling that "something is not right"    Complete by:  As directed    Notify physician for increase or change in vaginal discharge    Complete by:  As directed    Notify physician for intestinal cramps, with or without diarrhea, sometimes described as "gas pain"    Complete by:  As directed    Notify physician for leaking of fluid    Complete by:  As directed    Notify physician for low, dull backache, unrelieved by heat or Tylenol    Complete by:  As directed    Notify physician for menstrual like cramps    Complete by:  As directed    Notify physician for pelvic pressure    Complete by:  As directed    Notify physician for uterine contractions.  These may be painless and feel like the uterus is tightening or the baby is  "balling up"    Complete by:  As directed    Notify physician for vaginal bleeding    Complete by:  As directed    PRETERM LABOR:  Includes any of the follwing symptoms that occur between 20 - [redacted] weeks gestation.  If these symptoms are not stopped, preterm labor can result in preterm delivery, placing your baby at risk    Complete by:  As directed      Allergies as of 11/06/2016      Reactions   Latex       Medication List    STOP taking these medications   amoxicillin-clavulanate 500-125 MG tablet Commonly known as:  AUGMENTIN   cephALEXin 500 MG capsule Commonly known as:  KEFLEX   cyclobenzaprine 10 MG tablet Commonly known as:  FLEXERIL   nitrofurantoin 100 MG capsule Commonly known as:  MACRODANTIN     TAKE these medications   acetaminophen 325 MG tablet Commonly known as:  TYLENOL Take 650 mg by mouth as needed.   albuterol 108 (90 Base) MCG/ACT inhaler Commonly known as:  PROVENTIL HFA;VENTOLIN HFA Inhale 2 puffs into the lungs every 6 (six) hours as needed for wheezing or shortness of breath.   PRENATAL 19 tablet Take 1 tablet by mouth daily.   ranitidine 300 MG tablet Commonly known as:  ZANTAC Take 0.5 tablets (150 mg total) by mouth at bedtime.        Signed: Vena Austriandreas Alice Burnside 11/06/2016, 2:46 AM

## 2016-11-13 ENCOUNTER — Ambulatory Visit (INDEPENDENT_AMBULATORY_CARE_PROVIDER_SITE_OTHER): Payer: Medicare Other | Admitting: Obstetrics and Gynecology

## 2016-11-13 DIAGNOSIS — O09893 Supervision of other high risk pregnancies, third trimester: Secondary | ICD-10-CM

## 2016-11-13 DIAGNOSIS — O09293 Supervision of pregnancy with other poor reproductive or obstetric history, third trimester: Secondary | ICD-10-CM | POA: Insufficient documentation

## 2016-11-13 DIAGNOSIS — O0993 Supervision of high risk pregnancy, unspecified, third trimester: Secondary | ICD-10-CM

## 2016-11-13 DIAGNOSIS — O0933 Supervision of pregnancy with insufficient antenatal care, third trimester: Secondary | ICD-10-CM

## 2016-11-13 DIAGNOSIS — O09213 Supervision of pregnancy with history of pre-term labor, third trimester: Secondary | ICD-10-CM

## 2016-11-13 DIAGNOSIS — I38 Endocarditis, valve unspecified: Secondary | ICD-10-CM

## 2016-11-13 NOTE — Progress Notes (Signed)
New Obstetric Patient H&P    Chief Complaint: "Desires prenatal care"   History of Present Illness: Patient is a 31 y.o. [phone] Not Hispanic or Latino  Estimated Date of Delivery: 01/02/17 and her EGA is [redacted]w[redacted]d presenting for transfer of care from [facility].  Her pregnancy is notable for a history of preeclampsia, preterm delivery, late prenatal care, and PTSD/depression. Records from [facility] are reviewed and there is mention of a history of valvular heart disease, however echo obtained this pregnancy showed normal cardiac anatomy and systolic/diastolic function.   She reports +FM, no LOF, no VB, occasional braxton hicks contractions but no patterns.  Review of Systems:10 point review of systems negative unless otherwise noted in HPI  Past Medical History:  Past Medical History:  Diagnosis Date  . Asthma   . Dysrhythmia   . Heart murmur   . Pre-eclampsia   . Preeclampsia   . Preeclampsia   . Preeclampsia   . Pregnancy induced hypertension   . Preterm labor   . Sciatica   . Vaginal Pap smear, abnormal     Past Surgical History:  Past Surgical History:  Procedure Laterality Date  . HERNIA REPAIR      Gynecologic History: Patient's last menstrual period was 03/27/2016.  Obstetric History: [id number]  Family History:  No family history on file.  Social History:  Social History   Social History  . Marital status: Single    Spouse name: N/A  . Number of children: N/A  . Years of education: N/A   Occupational History  . Not on file.   Social History Main Topics  . Smoking status: Former Smoker    Types: Cigarettes, Cigars    Quit date: 08/06/2016  . Smokeless tobacco: Never Used  . Alcohol use No  . Drug use: No  . Sexual activity: Yes    Birth control/ protection: None   Other Topics Concern  . Not on file   Social History Narrative  . No narrative on file    Allergies:  Allergies  Allergen Reactions  . Latex     Medications: Prior to Admission  medications   Medication Sig Start Date End Date Taking? Authorizing Provider  acetaminophen (TYLENOL) 325 MG tablet Take 650 mg by mouth as needed.   Yes [provider]  albuterol (PROVENTIL HFA;VENTOLIN HFA) 108 (90 Base) MCG/ACT inhaler Inhale 2 puffs into the lungs every 6 (six) hours as needed for wheezing or shortness of breath. 09/11/16  Yes Enid Derry, PA-C  Prenatal Vit-DSS-Fe Fum-FA (PRENATAL 19) tablet Take 1 tablet by mouth daily.   Yes [provider]  ranitidine (ZANTAC) 300 MG tablet Take 0.5 tablets (150 mg total) by mouth at bedtime. 07/02/16 07/09/16  Orvil Feil, PA-C    Physical Exam Vitals: Blood pressure 110/70, weight 216 lb (98 kg), last menstrual period 03/27/2016, not currently breastfeeding.  General: NAD HEENT: normocephalic, anicteric Pulmonary: No increased work of breathing Abdomen: NABS, soft, non-tender, non-distended.  Umbilicus without lesions.  No hepatomegaly, splenomegaly or masses palpable. No evidence of hernia  Extremities: no edema, erythema, or tenderness Neurologic: Grossly intact Psychiatric: mood appropriate, affect full   Assessment: 31 y.o. [id number] at [redacted]w[redacted]d presenting to initiate prenatal care  Plan: 1) Avoid alcoholic beverages. 2) Patient encouraged not to smoke.  3) Discontinue the use of all non-medicinal drugs and chemicals.  4) Take prenatal vitamins daily.  5) Nutrition, food safety (fish, cheese advisories, and high nitrite foods) and exercise discussed.  6) Hospital and practice style discussed with cross coverage system.    Vena AustriaAndreas Staebler, MD

## 2016-11-27 IMAGING — CT CT HEAD W/O CM
1 series · 16 of 30 positions shown, 20 images · non-contrast
Comparison: 03/17/2014

CLINICAL DATA: Headache.  Postpartum.  Elevated blood pressure.

EXAM:
CT HEAD WITHOUT CONTRAST
TECHNIQUE: Contiguous axial images were obtained from the base of the skull
through the vertex without intravenous contrast.

[Series 2: head wo · axial · 0.39mm/px · z∈[-186,-60]mm · 16 of 32 slices shown, 20 images]
[im 2/32  brain]
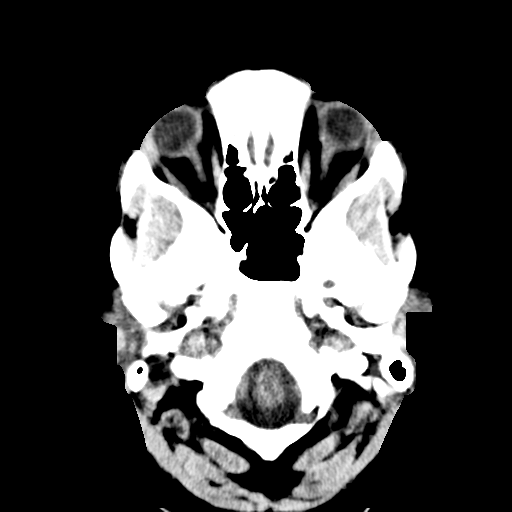
[im 2/32  bone]
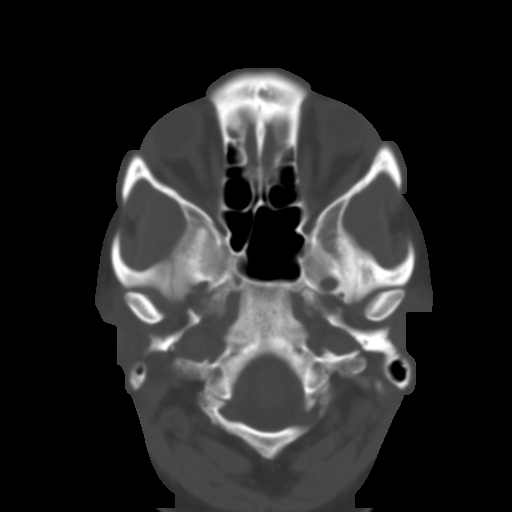
[im 4/32  brain]
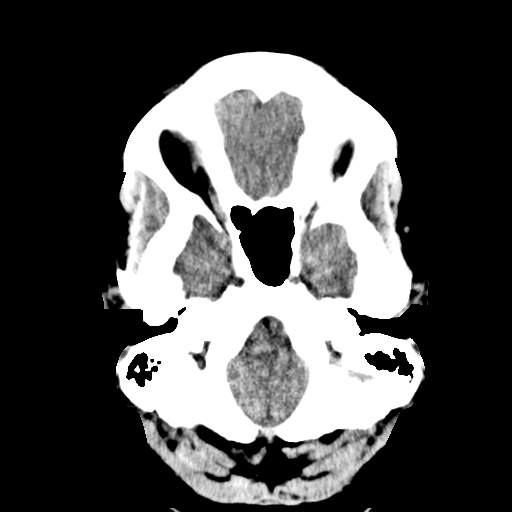
[im 6/32  brain]
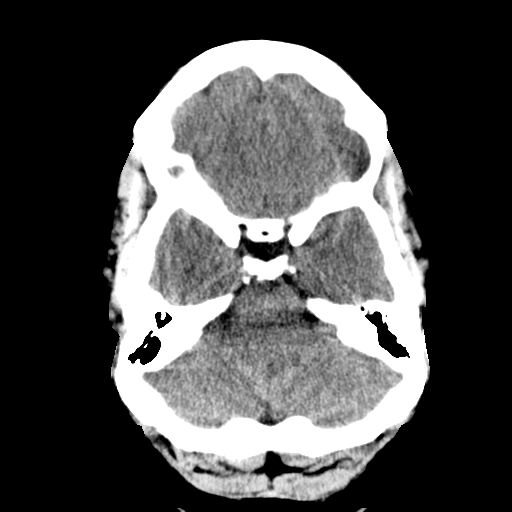
[im 8/32  brain]
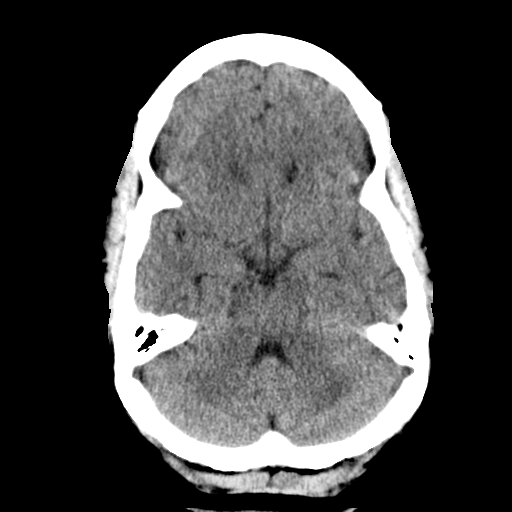
[im 9/32  brain]
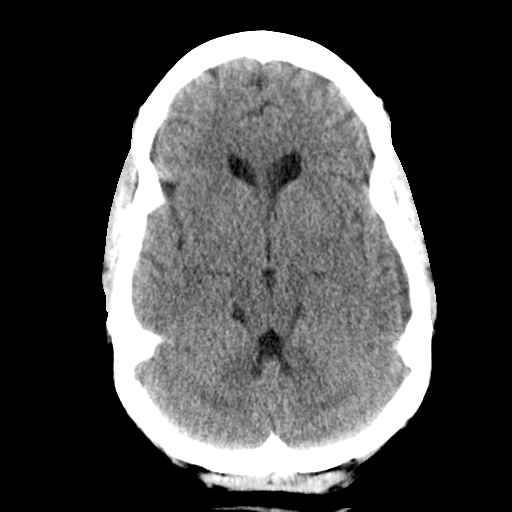
[im 9/32  bone]
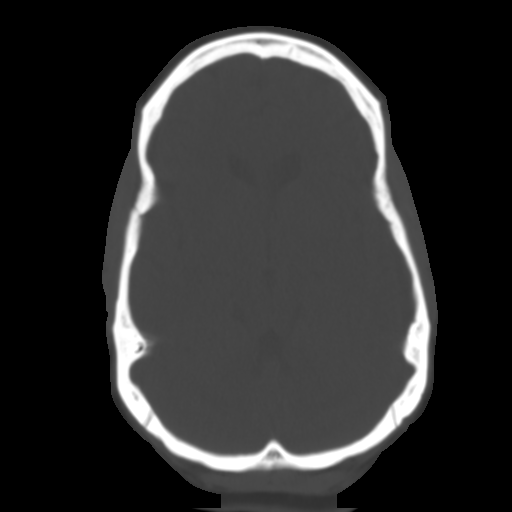
[im 11/32  brain]
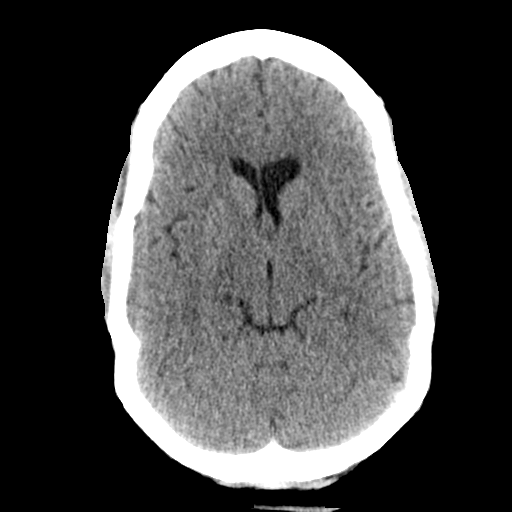
[im 13/32  brain]
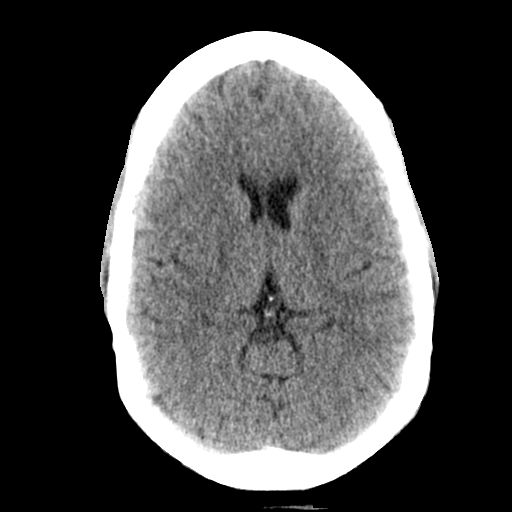
[im 15/32  brain]
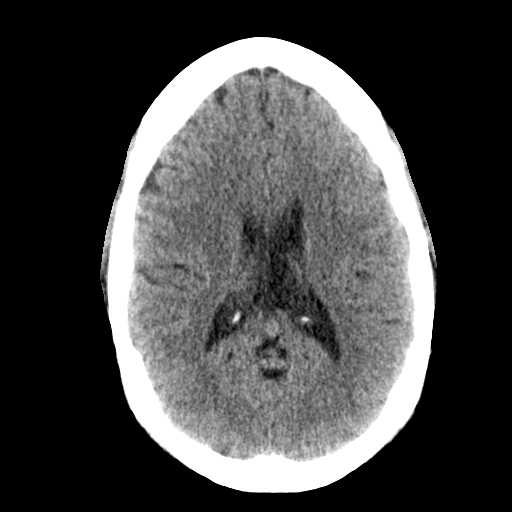
[im 17/32  brain]
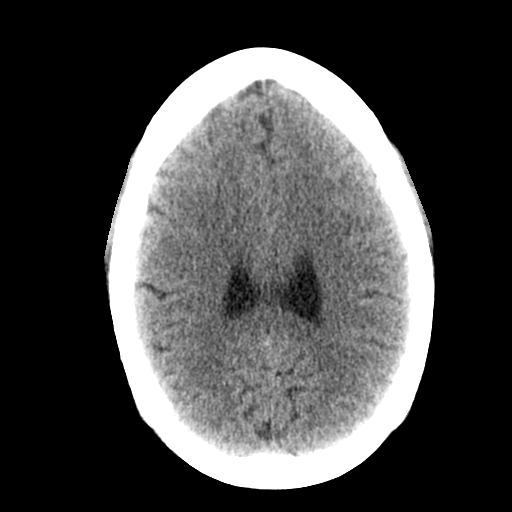
[im 17/32  bone]
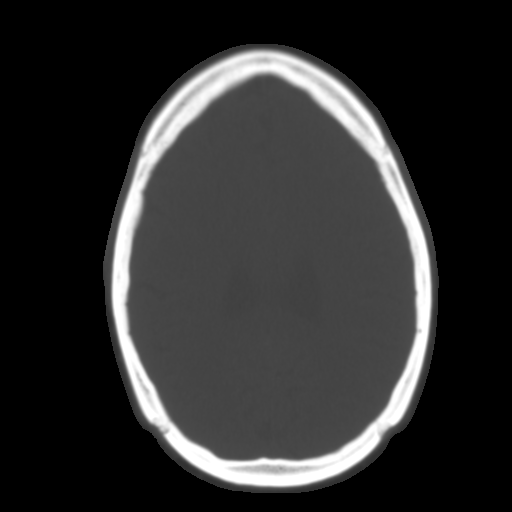
[im 19/32  brain]
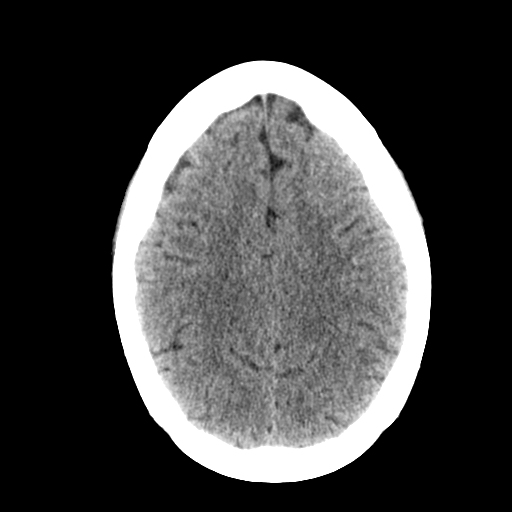
[im 21/32  brain]
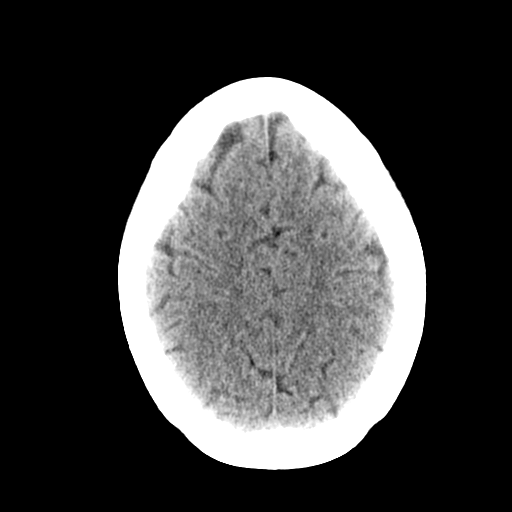
[im 23/32  brain]
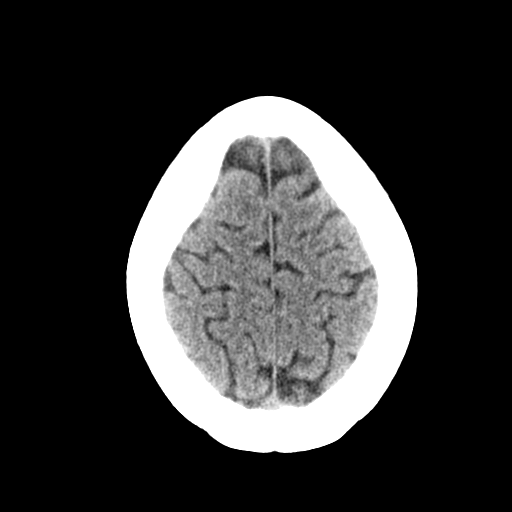
[im 24/32  brain]
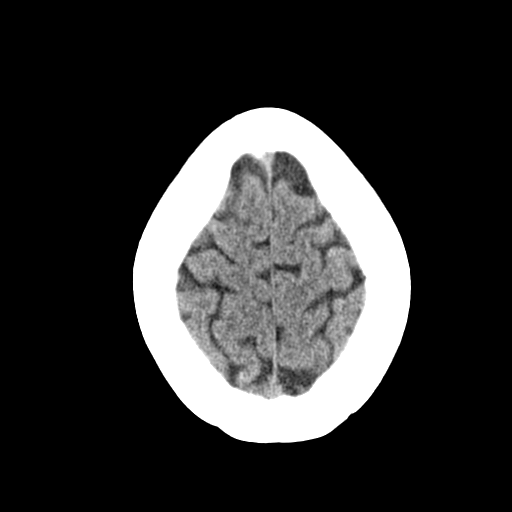
[im 24/32  bone]
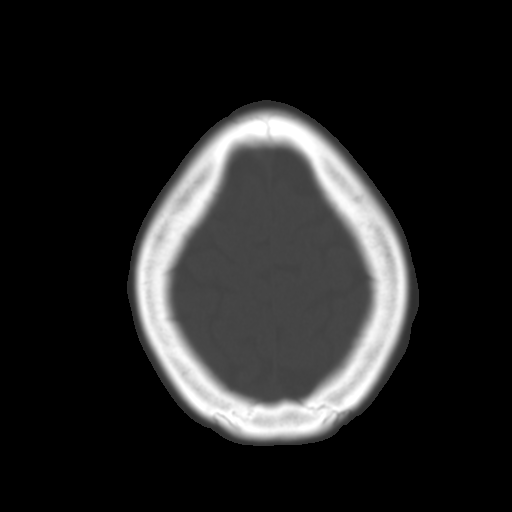
[im 26/32  brain]
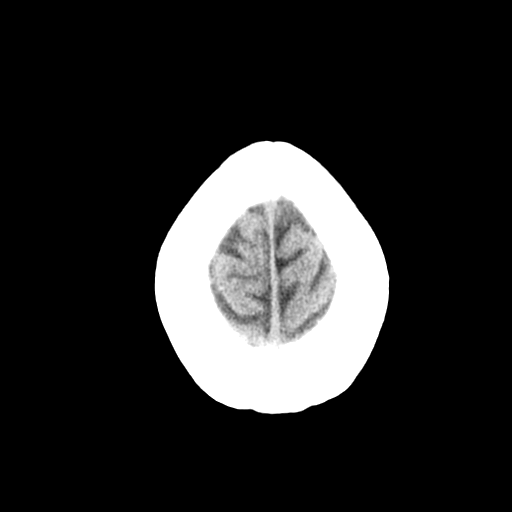
[im 28/32  brain]
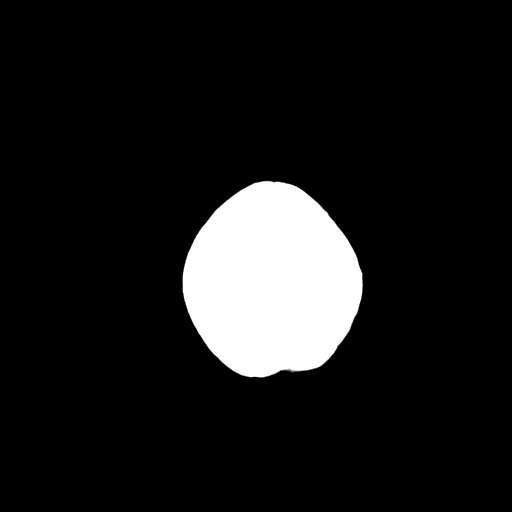
[im 30/32  brain]
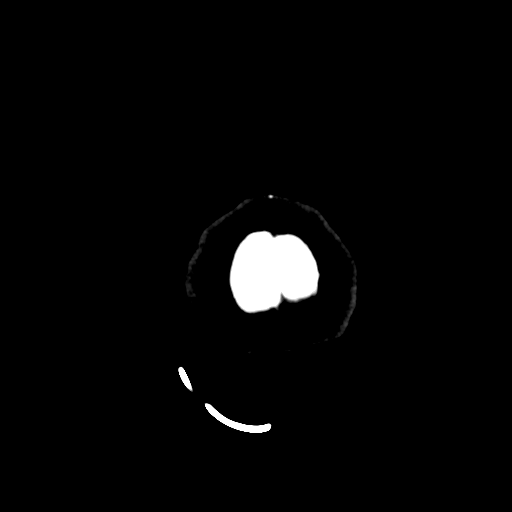

[16 of 30 positions shown; findings below may reference images not displayed]

FINDINGS: Sinuses/Soft tissues: Hypoplastic frontal sinuses. Other paranasal
sinuses and mastoid air cells clear.

Intracranial: No mass lesion, hemorrhage, hydrocephalus, acute
infarct, intra-axial, or extra-axial fluid collection.
IMPRESSION: Normal head CT.

## 2016-11-28 ENCOUNTER — Ambulatory Visit (INDEPENDENT_AMBULATORY_CARE_PROVIDER_SITE_OTHER): Payer: Medicare Other | Admitting: Obstetrics and Gynecology

## 2016-11-28 VITALS — BP 118/74 | Wt 220.0 lb

## 2016-11-28 DIAGNOSIS — O0993 Supervision of high risk pregnancy, unspecified, third trimester: Secondary | ICD-10-CM

## 2016-11-28 DIAGNOSIS — O09293 Supervision of pregnancy with other poor reproductive or obstetric history, third trimester: Secondary | ICD-10-CM

## 2016-11-28 DIAGNOSIS — O0933 Supervision of pregnancy with insufficient antenatal care, third trimester: Secondary | ICD-10-CM

## 2016-11-28 DIAGNOSIS — O09213 Supervision of pregnancy with history of pre-term labor, third trimester: Secondary | ICD-10-CM

## 2016-11-28 DIAGNOSIS — O09893 Supervision of other high risk pregnancies, third trimester: Secondary | ICD-10-CM

## 2016-11-28 DIAGNOSIS — Z3A35 35 weeks gestation of pregnancy: Secondary | ICD-10-CM

## 2016-11-28 NOTE — Progress Notes (Signed)
Prenatal Visit Note Date: 11/28/2016 Clinic: [facility]  Subjective:  Robin Myers is a 31 y.o. [phone] at 7478w0d being seen today for ongoing prenatal care.  She is currently monitored for the following issues for this high-risk pregnancy and has Major depression, recurrent (HCC); Posttraumatic stress disorder; Headache; Valvular heart disease; Supervision of high risk pregnancy, antepartum, third trimester; History of preterm delivery, currently pregnant in third trimester; History of pre-eclampsia in prior pregnancy, currently pregnant in third trimester; and Late prenatal care affecting pregnancy in third trimester on her problem list.  Patient reports backache, no bleeding, no contractions, no cramping and no leaking.   Contractions: Not present. Vag. Bleeding: None.  Movement: Present. Denies leaking of fluid.   The following portions of the patient's history were reviewed and updated as appropriate: allergies, current medications, past family history, past medical history, past social history, past surgical history and problem list. Problem list updated.  Objective:   Vitals:   11/28/16 1619  BP: 118/74  Weight: 220 lb (99.8 kg)    Fetal Status: Fetal Heart Rate (bpm): 145 Fundal Height: 35 cm Movement: Present     General:  Alert, oriented and cooperative. Patient is in no acute distress.  Skin: Skin is warm and dry. No rash noted.   Cardiovascular: Normal heart rate noted  Respiratory: Normal respiratory effort, no problems with respiration noted  Abdomen: Soft, gravid, appropriate for gestational age. Pain/Pressure: Present     Pelvic:  Cervical exam performed Dilation: 1 Effacement (%): 20 Station: Ballotable  Extremities: Normal range of motion.     Mental Status: Normal mood and affect. Normal behavior. Normal judgment and thought content.   Urinalysis:      Assessment and Plan:  Pregnancy: [id number] at 6678w0d  1. History of pre-eclampsia in prior pregnancy,  currently pregnant in third trimester 2. History of preterm delivery, currently pregnant in third trimester 3. Late prenatal care affecting pregnancy in third trimester 4. Supervision of high risk pregnancy, antepartum, third trimester 5. [redacted] weeks gestation of pregnancy  Preterm labor symptoms and general obstetric precautions including but not limited to vaginal bleeding, contractions, leaking of fluid and fetal movement were reviewed in detail with the patient. Please refer to After Visit Summary for other counseling recommendations.  Return in about 1 week (around 12/05/2016) for Routine Prenatal Appointment.  Thomasene MohairStephen Jackson, MD 11/28/2016 4:40 PM

## 2016-12-01 ENCOUNTER — Encounter: Payer: Self-pay | Admitting: Obstetrics and Gynecology

## 2016-12-01 ENCOUNTER — Observation Stay
Admission: EM | Admit: 2016-12-01 | Discharge: 2016-12-01 | Disposition: A | Discharge status: Home / Self Care | Payer: Medicare Other | Attending: Certified Nurse Midwife | Admitting: Certified Nurse Midwife

## 2016-12-01 DIAGNOSIS — Z87891 Personal history of nicotine dependence: Secondary | ICD-10-CM | POA: Insufficient documentation

## 2016-12-01 DIAGNOSIS — B9689 Other specified bacterial agents as the cause of diseases classified elsewhere: Secondary | ICD-10-CM | POA: Diagnosis not present

## 2016-12-01 DIAGNOSIS — N760 Acute vaginitis: Secondary | ICD-10-CM | POA: Diagnosis not present

## 2016-12-01 DIAGNOSIS — Z79899 Other long term (current) drug therapy: Secondary | ICD-10-CM | POA: Insufficient documentation

## 2016-12-01 DIAGNOSIS — O09213 Supervision of pregnancy with history of pre-term labor, third trimester: Secondary | ICD-10-CM | POA: Diagnosis not present

## 2016-12-01 DIAGNOSIS — Z3A35 35 weeks gestation of pregnancy: Secondary | ICD-10-CM | POA: Diagnosis not present

## 2016-12-01 DIAGNOSIS — O23593 Infection of other part of genital tract in pregnancy, third trimester: Secondary | ICD-10-CM | POA: Diagnosis not present

## 2016-12-01 DIAGNOSIS — O0933 Supervision of pregnancy with insufficient antenatal care, third trimester: Secondary | ICD-10-CM | POA: Diagnosis not present

## 2016-12-01 DIAGNOSIS — O4290 Premature rupture of membranes, unspecified as to length of time between rupture and onset of labor, unspecified weeks of gestation: Secondary | ICD-10-CM | POA: Diagnosis present

## 2016-12-01 HISTORY — DX: Depression, unspecified: F32.A

## 2016-12-01 HISTORY — DX: Major depressive disorder, single episode, unspecified: F32.9

## 2016-12-01 HISTORY — DX: Post-traumatic stress disorder, unspecified: F43.10

## 2016-12-01 MED ORDER — FAMOTIDINE 20 MG PO TABS
20.0000 mg | ORAL_TABLET | Freq: Once | ORAL | Status: AC | PRN
  Administered 2016-12-01: 20 mg via ORAL
  Filled 2016-12-01: qty 1

## 2016-12-01 MED ORDER — RANITIDINE HCL 300 MG PO TABS: 150.0000 mg | ORAL_TABLET | Freq: Two times a day (BID) | ORAL | 1 refills | Status: DC | PRN

## 2016-12-01 MED ORDER — METRONIDAZOLE 500 MG PO TABS
500.0000 mg | ORAL_TABLET | Freq: Two times a day (BID) | ORAL | Status: DC
  Administered 2016-12-01: 500 mg via ORAL
  Filled 2016-12-01: qty 1

## 2016-12-01 MED ORDER — METRONIDAZOLE 500 MG PO TABS: 500.0000 mg | ORAL_TABLET | Freq: Two times a day (BID) | ORAL | 0 refills | Status: DC

## 2016-12-01 NOTE — Discharge Instructions (Signed)
Call or return to [facility] with: Contractions Vaginal bleeding Leaking of fluid Decreased fetal movement

## 2016-12-01 NOTE — OB Triage Note (Signed)
Pt presents to L&D with complaints of leaking clear, thin, fluid since 11/29/16. Pt denies c/o vaginal bleeding, reports good fetal movement. Pt with c/o cramping abdominal pain associated with lower back pain. EFM and toco applied and explained. Plan to assess patient's complaint, monitor maternal and fetal well-being and notify CNM of pt complaint.

## 2016-12-01 NOTE — Final Progress Note (Signed)
Physician Final Progress Note  Patient ID: Robin Myers MRN: [id number] DOB/AGE: [birth year] 31 y.o.  Admit date: 12/01/2016 Admitting provider: Nadara Mustard, MD Discharge date: 12/01/2016   Admission Diagnoses: IUP at 35wk3d with leakage of fluid  Discharge Diagnoses:  IUP at 35wk3d with bacterial vaginosis  Consults: None  Significant Findings/ Diagnostic Studies: 31 year old G4P1203 with EDC=01/02/2017 by a 19 week ultrasound presents to L&D at 35wk3d with leakage of fluid x 3 days. The leakage has been off and on. She has not had IC in months. States that her vulva feels swollen. Baby active.  Prenatal care begun at [facility] and recently transferred to [facility] and is remarkable for close spacing of pregnancies, history of preterm births x 2 at 36 weeks, late presentation to care, history of preeclampsia, PTSD, depression, and a hx of valvular heart disease (normal echo 10/27/2016).  Past Surgical History:  Procedure Laterality Date  . HERNIA REPAIR     Social History   Social History  . Marital status: Single    Spouse name: N/A  . Number of children: 3  . Years of education: 12   Occupational History  . Not on file.   Social History Main Topics  . Smoking status: Former Smoker    Types: Cigarettes, Cigars    Quit date: 08/06/2016  . Smokeless tobacco: Never Used  . Alcohol use No  . Drug use: No  . Sexual activity: Yes    Birth control/ protection: None   Other Topics Concern  . Not on file   Social History Narrative  . No narrative on file   OB History  Gravida Para Term Preterm AB Living  4 3 1 2   3   SAB TAB Ectopic Multiple Live Births        0 3    # Outcome Date GA Lbr Len/2nd Weight Sex Delivery Anes PTL Lv  4 Current           3 Term 04/10/15 [redacted]w[redacted]d 10:58 / 00:03 2.67 kg (5 lb 14.2 oz) F Vag-Spont Local N LIV  2 Preterm 03/15/14 [redacted]w[redacted]d  2.268 kg (5 lb) F Vag-Spont  Y LIV     Complications: Preeclampsia  1 Preterm 04/16/06 [redacted]w[redacted]d  2.608 kg (5  lb 12 oz) F Vag-Spont None Y LIV     Exam: general: in NAD, presents with 3 children and FOB BP 129/74 (BP Location: Left Arm)   Pulse (!) 110   Temp 98.1 F (36.7 C) (Oral)   Resp 16   Ht 5\' 5"  (1.651 m)   Wt 99.8 kg (220 lb)   LMP 03/27/2016   SpO2 99%   BMI 36.61 kg/m    Abdomen: SSE: Ext/BUS: no lesions or inflammation Vagina: white homogenous discharge, small amount Wet prep: positive clue cells, negative hyphae and Trich Fern neg, Nitrazine negative. FHR: 140s with accelerations to 160s-170s, moderate variability. 2 variable decelerations to 90s to 100 x 20-30 seconds when supine. No further decelerations after position change to side x 1 hour Toco: occasional contraction Cervix: ext os 1 cm, internal os?FT/ 30%/-1 to -2  AFI: 2.73+ 4.38+ 4.47+2.06cm=13.84cm/ vertex/ fetal stomach and bladder seen.  A: IUP at 35wk3d with bacterial vaginosis Reassuring FHR tracing Normal AFI  P: Flagyl 500 mgm BID with food x 7 days. First dose given before discharge Follow up as scheduled 20 July at [facility] Refill of ranitidine 300 mgm -1/2 tab BID prn  heartburn  Procedures: AFI  Discharge Condition:  stable  Disposition: home Diet: Regular diet  Discharge Activity: Activity as tolerated   Allergies as of 12/01/2016      Reactions   Latex       Medication List    TAKE these medications   acetaminophen 325 MG tablet Commonly known as:  TYLENOL Take 650 mg by mouth as needed.   albuterol 108 (90 Base) MCG/ACT inhaler Commonly known as:  PROVENTIL HFA;VENTOLIN HFA Inhale 2 puffs into the lungs every 6 (six) hours as needed for wheezing or shortness of breath.   metroNIDAZOLE 500 MG tablet Commonly known as:  FLAGYL Take 1 tablet (500 mg total) by mouth 2 (two) times daily.   PRENATAL 19 tablet Take 1 tablet by mouth daily.   ranitidine 300 MG tablet Commonly known as:  ZANTAC Take 0.5 tablets (150 mg total) by mouth 2 (two) times daily as needed for  heartburn. What changed:  when to take this  reasons to take this      Follow-up Information    Farrel ConnersGutierrez, Noretta Frier, CNM. Go on 12/05/2016.   Specialty:  Certified Nurse Midwife Contact information: [address] [place] [address] [phone]           Total time spent taking care of this patient: 20 minutes  Signed: Farrel Connersolleen Kamau Weatherall 12/01/2016, 10:46 PM

## 2016-12-05 ENCOUNTER — Ambulatory Visit (INDEPENDENT_AMBULATORY_CARE_PROVIDER_SITE_OTHER): Payer: Medicare Other | Admitting: Certified Nurse Midwife

## 2016-12-05 VITALS — BP 110/64 | Wt 221.0 lb

## 2016-12-05 DIAGNOSIS — Z3A36 36 weeks gestation of pregnancy: Secondary | ICD-10-CM

## 2016-12-05 DIAGNOSIS — Z113 Encounter for screening for infections with a predominantly sexual mode of transmission: Secondary | ICD-10-CM

## 2016-12-05 DIAGNOSIS — O0993 Supervision of high risk pregnancy, unspecified, third trimester: Secondary | ICD-10-CM

## 2016-12-05 DIAGNOSIS — Z3685 Encounter for antenatal screening for Streptococcus B: Secondary | ICD-10-CM

## 2016-12-05 NOTE — Progress Notes (Signed)
Pt c/o pelvic pressure and pain, feels like baby has dropped and has a watery discharge. GBS/aptima today.

## 2016-12-05 NOTE — Progress Notes (Signed)
Seen in L&D on 7/16 for watery discharge and diagnosed with BV Still taking Flagyl GBS/APtima done Nitrazine paper negative; wet prep: few clue cells, no hyphae Cervix: 1/thick/-2 and ballotable Having BH contractions ROB in 1 week

## 2016-12-07 LAB — STREP GP B NAA: Strep Gp B NAA: NEGATIVE

## 2016-12-09 ENCOUNTER — Telehealth: Payer: Self-pay

## 2016-12-09 ENCOUNTER — Observation Stay
Admission: EM | Admit: 2016-12-09 | Discharge: 2016-12-09 | Disposition: A | Discharge status: Home / Self Care | Payer: Medicare Other | Attending: Certified Nurse Midwife | Admitting: Certified Nurse Midwife

## 2016-12-09 DIAGNOSIS — Z87891 Personal history of nicotine dependence: Secondary | ICD-10-CM | POA: Diagnosis not present

## 2016-12-09 DIAGNOSIS — Z3A36 36 weeks gestation of pregnancy: Secondary | ICD-10-CM | POA: Insufficient documentation

## 2016-12-09 DIAGNOSIS — O0993 Supervision of high risk pregnancy, unspecified, third trimester: Secondary | ICD-10-CM

## 2016-12-09 DIAGNOSIS — O09293 Supervision of pregnancy with other poor reproductive or obstetric history, third trimester: Secondary | ICD-10-CM

## 2016-12-09 DIAGNOSIS — O4703 False labor before 37 completed weeks of gestation, third trimester: Secondary | ICD-10-CM

## 2016-12-09 DIAGNOSIS — O09893 Supervision of other high risk pregnancies, third trimester: Secondary | ICD-10-CM

## 2016-12-09 DIAGNOSIS — O26893 Other specified pregnancy related conditions, third trimester: Secondary | ICD-10-CM | POA: Diagnosis present

## 2016-12-09 DIAGNOSIS — R102 Pelvic and perineal pain: Secondary | ICD-10-CM

## 2016-12-09 DIAGNOSIS — O0933 Supervision of pregnancy with insufficient antenatal care, third trimester: Secondary | ICD-10-CM

## 2016-12-09 DIAGNOSIS — O09213 Supervision of pregnancy with history of pre-term labor, third trimester: Secondary | ICD-10-CM

## 2016-12-09 MED ORDER — ZOLPIDEM TARTRATE 5 MG PO TABS: 5.0000 mg | ORAL_TABLET | Freq: Every evening | ORAL | 1 refills | Status: DC | PRN

## 2016-12-09 NOTE — Telephone Encounter (Signed)
Pt is 1648w4d, in the worst pain ever, can't walk, sit or move legs without pain, feels like something is rubbing together, pain in back that moves to the front.  [phone].  Pt states she had ctxs last night and this am ~5-3510min apart but not now.  Adv if in that much pain to go to L&D via ER.  Mindy in L&D notified.

## 2016-12-09 NOTE — OB Triage Note (Signed)
Pt presents from the ED c/o of ctx since 2151 on 12/08/16. Ctx pain rated 10/10. Pt also complains of feeling like her pelvic bones are rubbing together. Pt denies LOF or VB. Pt states positive fetal movement.

## 2016-12-09 NOTE — Final Progress Note (Signed)
Physician Final Progress Note  Patient ID: Robin Myers MRN: [id number] DOB/AGE: January 13, 1986 31 y.o.  Admit date: 12/09/2016 Admitting provider: Conard Novak, MD Discharge date: 12/09/2016   Admission Diagnoses: Pain over pubic bone at 36wk4d  Discharge Diagnoses:  Active Problems:   Pelvic pain affecting pregnancy in third trimester, antepartum  IUP at 36wk4days  Consults: None  Significant Findings/ Diagnostic Studies:  31 year old [phone] with EDC=01/02/2017 by a 19 week ultrasound presents to L&D at 36wk4d with pain over symphysis pubis, pain on right hip area, "right leg giving out", and not being able to sleep at night due to the discomfort. "I want to be induced."  Baby active.  Episodes of more regular contractions. No vaginal bleeding. Still taking Flagyl for BV diagnosed last week.  Denies dysuria. Prenatal care begun at [facility] and recently transferred to [facility] and is remarkable for close spacing of pregnancies, history of preterm births x 2 at 36 weeks, late presentation to care, history of preeclampsia, PTSD, depression, and a hx of valvular heart disease (normal echo 10/27/2016).  OB History  Gravida Para Term Preterm AB Living  4 3 1 2   3   SAB TAB Ectopic Multiple Live Births        0 3    # Outcome Date GA Lbr Len/2nd Weight Sex Delivery Anes PTL Lv  4 Current           3 Term 04/10/15 [redacted]w[redacted]d 10:58 / 00:03 2.67 kg (5 lb 14.2 oz) F Vag-Spont Local N LIV  2 Preterm 03/15/14 [redacted]w[redacted]d  2.268 kg (5 lb) F Vag-Spont  Y LIV     Complications: Preeclampsia  1 Preterm 04/16/06 [redacted]w[redacted]d  2.608 kg (5 lb 12 oz) F Vag-Spont None Y LIV     Social History   Social History  . Marital status: Single    Spouse name: N/A  . Number of children: 3  . Years of education: 12   Occupational History  . Not on file.   Social History Main Topics  . Smoking status: Former Smoker    Types: Cigarettes, Cigars    Quit date: 08/06/2016  . Smokeless tobacco: Never Used  .  Alcohol use No  . Drug use: No  . Sexual activity: Yes    Birth control/ protection: None   Other Topics Concern  . Not on file   Social History Narrative  . No narrative on file   Exam: BP 116/83 (BP Location: Left Arm)   Pulse (!) 112   Temp 97.9 F (36.6 C) (Oral)   Resp 16   Ht 5\' 5"  (1.651 m)   Wt 100.2 kg (221 lb)   LMP 03/27/2016   BMI 36.78 kg/m   General : sitting up in bed in NAD Musculoskeletal: tenderness over SP. FHR: 145 with accelerations to 160s to 170, moderate variability Toco: some uterine irritability, occasional mild contraction  Cervix: 2/40%/-3/posterior per RN exam  A: Pelvic laxity at 36wk4d  P: DC home with labor precautions Comfort and safety measures discussed [facility] as scheduled if NIL Ambien 5 mgm for sleep prn #10/NR  Farrel Conners, CNM Procedures: none  Discharge Condition: stable  Disposition: 01-Home or Self Care  Diet: Regular diet  Discharge Activity: Activity as tolerated   Allergies as of 12/09/2016      Reactions   Latex       Medication List    TAKE these medications   acetaminophen 325 MG tablet  Commonly known as:  TYLENOL Take 650 mg by mouth as needed.   albuterol 108 (90 Base) MCG/ACT inhaler Commonly known as:  PROVENTIL HFA;VENTOLIN HFA Inhale 2 puffs into the lungs every 6 (six) hours as needed for wheezing or shortness of breath.   cyclobenzaprine 5 MG tablet Commonly known as:  FLEXERIL Take by mouth.   metroNIDAZOLE 500 MG tablet Commonly known as:  FLAGYL Take 1 tablet (500 mg total) by mouth 2 (two) times daily.   PRENATAL 19 tablet Take 1 tablet by mouth daily.   ranitidine 300 MG tablet Commonly known as:  ZANTAC Take 0.5 tablets (150 mg total) by mouth 2 (two) times daily as needed for heartburn.   zolpidem 5 MG tablet Commonly known as:  AMBIEN Take 1 tablet (5 mg total) by mouth at bedtime as needed for sleep.        Total time spent taking care of this patient:  15 minutes  Signed: Farrel Connersolleen Kaelum Kissick 12/09/2016, 6:10 PM

## 2016-12-10 LAB — GC/CHLAMYDIA PROBE AMP
CHLAMYDIA, DNA PROBE: NEGATIVE
NEISSERIA GONORRHOEAE BY PCR: NEGATIVE

## 2016-12-11 ENCOUNTER — Observation Stay
Admission: EM | Admit: 2016-12-11 | Discharge: 2016-12-11 | Disposition: A | Discharge status: Home / Self Care | Payer: Medicare Other | Attending: Obstetrics and Gynecology | Admitting: Obstetrics and Gynecology

## 2016-12-11 DIAGNOSIS — Z79899 Other long term (current) drug therapy: Secondary | ICD-10-CM | POA: Insufficient documentation

## 2016-12-11 DIAGNOSIS — Z87891 Personal history of nicotine dependence: Secondary | ICD-10-CM | POA: Insufficient documentation

## 2016-12-11 DIAGNOSIS — O368190 Decreased fetal movements, unspecified trimester, not applicable or unspecified: Secondary | ICD-10-CM | POA: Diagnosis present

## 2016-12-11 DIAGNOSIS — N898 Other specified noninflammatory disorders of vagina: Secondary | ICD-10-CM | POA: Diagnosis not present

## 2016-12-11 DIAGNOSIS — O99513 Diseases of the respiratory system complicating pregnancy, third trimester: Secondary | ICD-10-CM | POA: Diagnosis not present

## 2016-12-11 DIAGNOSIS — O368130 Decreased fetal movements, third trimester, not applicable or unspecified: Secondary | ICD-10-CM | POA: Diagnosis not present

## 2016-12-11 DIAGNOSIS — O26893 Other specified pregnancy related conditions, third trimester: Secondary | ICD-10-CM | POA: Diagnosis not present

## 2016-12-11 DIAGNOSIS — J45909 Unspecified asthma, uncomplicated: Secondary | ICD-10-CM | POA: Insufficient documentation

## 2016-12-11 DIAGNOSIS — O4703 False labor before 37 completed weeks of gestation, third trimester: Secondary | ICD-10-CM

## 2016-12-11 DIAGNOSIS — Z3A36 36 weeks gestation of pregnancy: Secondary | ICD-10-CM | POA: Insufficient documentation
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
## 2016-12-11 NOTE — OB Triage Note (Signed)
2020 Pt to OBS 3 with c/o pubic pain, decreased fetal movement, and increase in vaginal discharge.  Pt up to bathroom to change into hospital gown.    2024 EFM explained and applied.  No acute distress noted will continue to monitor   2045 Dr. Jean RosenthalJackson notified of pt status no orders given

## 2016-12-12 ENCOUNTER — Ambulatory Visit (INDEPENDENT_AMBULATORY_CARE_PROVIDER_SITE_OTHER): Payer: Medicare Other | Admitting: Advanced Practice Midwife

## 2016-12-12 VITALS — BP 90/40 | Wt 227.0 lb

## 2016-12-12 DIAGNOSIS — Z3A37 37 weeks gestation of pregnancy: Secondary | ICD-10-CM

## 2016-12-12 NOTE — Progress Notes (Signed)
Patient unable to wait to be seen for full evaluation today- said she has to go take care of her other children. We listened to baby. She clearly doesn't feel great, but I was unable to get the complete story.

## 2016-12-12 NOTE — Discharge Summary (Signed)
See final progress note. 

## 2016-12-12 NOTE — Progress Notes (Signed)
C/o nausea.  D/c has period smell.  Went to hosp yesterday for pain, diarrhea, vomiting.  Feels like she has to poop but nothing will come out.

## 2016-12-19 ENCOUNTER — Ambulatory Visit (INDEPENDENT_AMBULATORY_CARE_PROVIDER_SITE_OTHER): Payer: Medicare Other | Admitting: Certified Nurse Midwife

## 2016-12-19 VITALS — BP 108/78 | Wt 230.0 lb

## 2016-12-19 DIAGNOSIS — O0993 Supervision of high risk pregnancy, unspecified, third trimester: Secondary | ICD-10-CM

## 2016-12-19 DIAGNOSIS — F329 Major depressive disorder, single episode, unspecified: Secondary | ICD-10-CM

## 2016-12-19 DIAGNOSIS — O99343 Other mental disorders complicating pregnancy, third trimester: Secondary | ICD-10-CM

## 2016-12-19 DIAGNOSIS — Z3A38 38 weeks gestation of pregnancy: Secondary | ICD-10-CM

## 2016-12-19 MED ORDER — ALBUTEROL SULFATE HFA 108 (90 BASE) MCG/ACT IN AERS: 2.0000 | INHALATION_SPRAY | Freq: Four times a day (QID) | RESPIRATORY_TRACT | 0 refills | Status: DC | PRN

## 2016-12-19 MED ORDER — CYCLOBENZAPRINE HCL 5 MG PO TABS: 5.0000 mg | ORAL_TABLET | Freq: Three times a day (TID) | ORAL | 1 refills | Status: DC | PRN

## 2016-12-19 NOTE — Progress Notes (Signed)
ROB/unable to get urine Pubic pain  Bleeding gums/mouth swollen Refill Zoloft/Flexaril/Inhaler

## 2016-12-21 MED ORDER — SERTRALINE HCL 50 MG PO TABS: ORAL_TABLET | ORAL | 2 refills | Status: DC

## 2016-12-21 NOTE — Progress Notes (Signed)
Tearful, can't sleep, in pain (pubic bone pain), contractions. Wants to be induced Could not afford to get Ambien prescription filled. Could not afford maternity support garment or hip stabilizer Desires refills on Flexeril/ Ventalin and Zoloft. Has not been on Zoloft since last pregnancy WIll start with 25 mgm x 1 week then increase to 50 mgm daily for depression Bottle/ Mirena Cervix thick and unable to reach fetal head Will have return next week for cervix check/ possibly to schedule induction if cervix ripe

## 2016-12-25 ENCOUNTER — Ambulatory Visit (INDEPENDENT_AMBULATORY_CARE_PROVIDER_SITE_OTHER): Payer: Medicare Other | Admitting: Obstetrics & Gynecology

## 2016-12-25 VITALS — BP 114/66 | Wt 234.0 lb

## 2016-12-25 DIAGNOSIS — Z3A38 38 weeks gestation of pregnancy: Secondary | ICD-10-CM

## 2016-12-25 DIAGNOSIS — O0993 Supervision of high risk pregnancy, unspecified, third trimester: Secondary | ICD-10-CM

## 2016-12-25 DIAGNOSIS — O26893 Other specified pregnancy related conditions, third trimester: Secondary | ICD-10-CM

## 2016-12-25 DIAGNOSIS — O09293 Supervision of pregnancy with other poor reproductive or obstetric history, third trimester: Secondary | ICD-10-CM

## 2016-12-25 DIAGNOSIS — R102 Pelvic and perineal pain: Secondary | ICD-10-CM

## 2016-12-25 NOTE — Progress Notes (Signed)
Irreg ctxs, no ROM or VB. Good FM. Cervix 2/50/-2, membrane stripped IOL pros and cons discussed.

## 2016-12-25 NOTE — Progress Notes (Signed)
History and Physical  Robin Myers is a 31 y.o. [id number] [redacted]w[redacted]d  for Induction of Labor scheduled due to Favorable cervix at term .   See labor record for pregnancy highlights.  No recent pain, bleeding, ruptured membranes, or other signs of progressing labor.  Pregnancy complicated by asthma, prior precalmpsia, late onset to care, obesity (BMI >30 and <40.)  GBS Neg.   PMHx: She  has a past medical history of Asthma; Depression; Dysrhythmia; Heart murmur; Pre-eclampsia; Pregnancy induced hypertension; Preterm labor; PTSD (post-traumatic stress disorder); Sciatica; and Vaginal Pap smear, abnormal. Also,  has a past surgical history that includes Hernia repair., family history includes Anxiety disorder in her brother, father, and mother; COPD in her father; Coronary artery disease in her maternal grandmother; Depression in her brother, father, and mother; Diabetes in her father, maternal grandmother, mother, and paternal grandmother; Hepatitis C in her father; Hypertension in her father, maternal grandmother, mother, and paternal grandmother; Kidney failure in her paternal grandmother; Prostate cancer in her maternal grandfather; Stroke in her mother.,  reports that she quit smoking about 4 months ago. Her smoking use included Cigarettes and Cigars. She has never used smokeless tobacco. She reports that she does not drink alcohol or use drugs. She has a current medication list which includes the following prescription(s): acetaminophen, albuterol, cyclobenzaprine, prenatal 19, ranitidine, and sertraline. Also, is allergic to latex. OB History  Gravida Para Term Preterm AB Living  4 3 1 2   3   SAB TAB Ectopic Multiple Live Births        0 3    # Outcome Date GA Lbr Len/2nd Weight Sex Delivery Anes PTL Lv  4 Current           3 Term 04/10/15 [redacted]w[redacted]d 10:58 / 00:03 5 lb 14.2 oz (2.67 kg) F Vag-Spont Local N LIV  2 Preterm 03/15/14 [redacted]w[redacted]d  5 lb (2.268 kg) F Vag-Spont  Y LIV     Complications:  Preeclampsia  1 Preterm 04/16/06 [redacted]w[redacted]d  5 lb 12 oz (2.608 kg) F Vag-Spont None Y LIV    Patient denies any other pertinent gynecologic issues.   Review of Systems  Constitutional: Negative for chills, fever and malaise/fatigue.  HENT: Negative for congestion, sinus pain and sore throat.   Eyes: Negative for blurred vision and pain.  Respiratory: Negative for cough and wheezing.   Cardiovascular: Negative for chest pain and leg swelling.  Gastrointestinal: Negative for abdominal pain, constipation, diarrhea, heartburn, nausea and vomiting.  Genitourinary: Negative for dysuria, frequency, hematuria and urgency.  Musculoskeletal: Negative for back pain, joint pain, myalgias and neck pain.  Skin: Negative for itching and rash.  Neurological: Negative for dizziness, tremors and weakness.  Endo/Heme/Allergies: Does not bruise/bleed easily.  Psychiatric/Behavioral: Negative for depression. The patient is not nervous/anxious and does not have insomnia.     Objective: BP 114/66   Wt 234 lb (106.1 kg)   LMP 03/27/2016   BMI 38.94 kg/m  Physical Exam  Vitals reviewed. Physical examination Constitutional NAD, Conversant  Skin No rashes, lesions or ulceration. Normal palpated skin turgor. No nodularity.  Lungs: Clear to auscultation.No rales or wheezes. Normal Respiratory effort, no retractions.  Heart: NSR.  No murmurs or rubs appreciated. No periferal edema  Abdomen: Gravid.  Non-tender.  No masses.  No HSM. No hernia  Extremities: Moves all appropriately.  Normal ROM for age. No lymphadenopathy.  Neuro: Grossly intact  Psych: Oriented to PPT.  Normal mood. Normal affect.     Pelvic:  Vulva: Normal appearance.  No lesions.  Vagina: No lesions or abnormalities noted.  Urethra No masses tenderness or scarring.  Meatus Normal size without lesions or prolapse.  Cervix: 2/50/-3.   Perineum: Normal exam.  No lesions.        Bimanual   Uterus: Enlarged.  Non-tender.    Adnexae: Not  palpated.  Cul-de-sac: Negative for abnormality.   Assessment: Term Pregnancy for Induction of Labor due to Favorable cervix at term.  Plan: Patient will undergo induction of labor with cervical ripening agents.     Patient has been fully informed of the pros and cons, risks and benefits of continued observation with fetal monitoring versus that of induction of labor.   She understands that there are uncommon risks to induction, which include but are not limited to : frequent or prolonged uterine contractions, fetal distress, uterine rupture, and lack of successful induction.  These risks include all methods including Pitocin and Misoprostol and Cervadil.  Patient understands that using Misoprostol for labor induction is an "off label" indication although it has been studied extensively for this purpose and is an accepted method of induction.  She also has been informed of the increased risks for Cesarean with induction and should induction not be successful.  Patient consents to the induction plan of management.  Plans to bottle feed Plans IUD for contraception  Annamarie MajorPaul Timea Breed, MD, Merlinda FrederickFACOG [facility], [facility] 12/25/2016  1:16 PM

## 2016-12-25 NOTE — Progress Notes (Signed)
ROB Discuss IOL

## 2016-12-25 NOTE — Addendum Note (Signed)
Addended by: Nadara MustardHARRIS, Tamu Golz P on: 12/25/2016 01:23 PM   Modules accepted: Orders, SmartSet

## 2016-12-26 ENCOUNTER — Encounter: Payer: Self-pay | Admitting: *Deleted

## 2016-12-26 ENCOUNTER — Inpatient Hospital Stay
Admission: EM | Admit: 2016-12-26 | Discharge: 2016-12-28 | DRG: 775 | Disposition: A | Discharge status: Home / Self Care | Payer: Medicare Other | Attending: Obstetrics & Gynecology | Admitting: Obstetrics & Gynecology

## 2016-12-26 DIAGNOSIS — O9952 Diseases of the respiratory system complicating childbirth: Principal | ICD-10-CM | POA: Diagnosis present

## 2016-12-26 DIAGNOSIS — Z3A39 39 weeks gestation of pregnancy: Secondary | ICD-10-CM | POA: Diagnosis not present

## 2016-12-26 DIAGNOSIS — O0993 Supervision of high risk pregnancy, unspecified, third trimester: Secondary | ICD-10-CM

## 2016-12-26 DIAGNOSIS — Z91040 Latex allergy status: Secondary | ICD-10-CM

## 2016-12-26 DIAGNOSIS — J45909 Unspecified asthma, uncomplicated: Secondary | ICD-10-CM | POA: Diagnosis present

## 2016-12-26 DIAGNOSIS — O09293 Supervision of pregnancy with other poor reproductive or obstetric history, third trimester: Secondary | ICD-10-CM

## 2016-12-26 DIAGNOSIS — O0933 Supervision of pregnancy with insufficient antenatal care, third trimester: Secondary | ICD-10-CM

## 2016-12-26 DIAGNOSIS — O26893 Other specified pregnancy related conditions, third trimester: Secondary | ICD-10-CM | POA: Diagnosis present

## 2016-12-26 DIAGNOSIS — Z87891 Personal history of nicotine dependence: Secondary | ICD-10-CM

## 2016-12-26 LAB — TYPE AND SCREEN
ABO/RH(D): O POS
ANTIBODY SCREEN: NEGATIVE

## 2016-12-26 LAB — CBC
HEMATOCRIT: 31.8 % — AB (ref 35.0–47.0)
HEMOGLOBIN: 10.5 g/dL — AB (ref 12.0–16.0)
MCH: 26.3 pg (ref 26.0–34.0)
MCHC: 33 g/dL (ref 32.0–36.0)
MCV: 79.8 fL — AB (ref 80.0–100.0)
Platelets: 254 10*3/uL (ref 150–440)
RBC: 3.99 MIL/uL (ref 3.80–5.20)
RDW: 15 % — ABNORMAL HIGH (ref 11.5–14.5)
WBC: 11.2 10*3/uL — AB (ref 3.6–11.0)

## 2016-12-26 MED ORDER — BUTORPHANOL TARTRATE 2 MG/ML IJ SOLN
1.0000 mg | INTRAMUSCULAR | Status: DC | PRN
  Administered 2016-12-26 (×2): 1 mg via INTRAVENOUS
  Filled 2016-12-26: qty 1

## 2016-12-26 MED ORDER — DIBUCAINE 1 % RE OINT
TOPICAL_OINTMENT | Freq: Three times a day (TID) | RECTAL | Status: DC | PRN
  Filled 2016-12-26: qty 28

## 2016-12-26 MED ORDER — LACTATED RINGERS IV SOLN
INTRAVENOUS | Status: DC
  Administered 2016-12-26 (×3): via INTRAVENOUS

## 2016-12-26 MED ORDER — OXYTOCIN 40 UNITS IN LACTATED RINGERS INFUSION - SIMPLE MED: 2.5000 [IU]/h | INTRAVENOUS | Status: DC

## 2016-12-26 MED ORDER — SOD CITRATE-CITRIC ACID 500-334 MG/5ML PO SOLN
30.0000 mL | Freq: Once | ORAL | Status: AC
  Administered 2016-12-26: 30 mL via ORAL

## 2016-12-26 MED ORDER — TERBUTALINE SULFATE 1 MG/ML IJ SOLN: 0.2500 mg | Freq: Once | INTRAMUSCULAR | Status: DC | PRN

## 2016-12-26 MED ORDER — OXYTOCIN BOLUS FROM INFUSION: 500.0000 mL | Freq: Once | INTRAVENOUS | Status: DC

## 2016-12-26 MED ORDER — OXYTOCIN 40 UNITS IN LACTATED RINGERS INFUSION - SIMPLE MED
1.0000 m[IU]/min | INTRAVENOUS | Status: DC
  Administered 2016-12-26: 2 m[IU]/min via INTRAVENOUS
  Administered 2016-12-27: 666 m[IU]/min via INTRAVENOUS
  Filled 2016-12-26: qty 1000

## 2016-12-26 MED ORDER — ACETAMINOPHEN 325 MG PO TABS: 650.0000 mg | ORAL_TABLET | ORAL | Status: DC | PRN

## 2016-12-26 MED ORDER — MISOPROSTOL 25 MCG QUARTER TABLET
25.0000 ug | ORAL_TABLET | ORAL | Status: DC | PRN
  Administered 2016-12-26 (×2): 25 ug via VAGINAL
  Filled 2016-12-26 (×2): qty 1

## 2016-12-26 MED ORDER — ONDANSETRON HCL 4 MG/2ML IJ SOLN
4.0000 mg | Freq: Four times a day (QID) | INTRAMUSCULAR | Status: DC | PRN
  Administered 2016-12-27: 4 mg via INTRAVENOUS
  Filled 2016-12-26: qty 2

## 2016-12-26 MED ORDER — SOD CITRATE-CITRIC ACID 500-334 MG/5ML PO SOLN
ORAL | Status: AC
  Administered 2016-12-26: 30 mL via ORAL
  Filled 2016-12-26: qty 15

## 2016-12-26 MED ORDER — LACTATED RINGERS IV SOLN
500.0000 mL | INTRAVENOUS | Status: DC | PRN
  Administered 2016-12-26: 500 mL via INTRAVENOUS

## 2016-12-26 NOTE — Progress Notes (Signed)
  Labor Progress Note   31 y.o. [id number] @ 3865w0d , admitted for  Pregnancy, Labor Management.   Subjective:  Mild ctx pains  Objective:  BP 116/74 (BP Location: Right Arm)   Pulse 81   Temp 98.2 F (36.8 C) (Axillary)   Resp 18   Ht 5\' 5"  (1.651 m)   Wt 230 lb (104.3 kg)   LMP 03/27/2016   BMI 38.27 kg/m  Abd: mild Extr: trace to 1+ bilateral pedal edema SVE: CERVIX: 3-4 cm dilated, 50 effaced, -3 station  EFM: FHR: 140 bpm, variability: moderate,  accelerations:  Present,  decelerations:  Absent Toco: irreg ctxs Labs: I have reviewed the patient's lab results.   Assessment & Plan:  [id number] @ 4765w0d, admitted for  Pregnancy and Labor/Delivery Management  1. Pain management: none. 2. FWB: FHT category 1.  3. ID: GBS negative 4. Labor management: Pitocin next (has received 2 doses of Cytotec)  All discussed with patient, see orders  Annamarie MajorPaul Harris, MD, Merlinda FrederickFACOG [facility], [facility] 12/26/2016  6:24 PM

## 2016-12-26 NOTE — H&P (Signed)
History and Physical  Robin Myers is a 31 y.o. [id number] 39 weeks  for Induction of Labor scheduled due to Favorable cervix at term .   See labor record for pregnancy highlights.  No recent pain, bleeding, ruptured membranes, or other signs of progressing labor.  Pregnancy complicated by asthma, prior precalmpsia, late onset to care, obesity (BMI >30 and <40.)  GBS Neg.   PMHx: She  has a past medical history of Asthma; Depression; Dysrhythmia; Heart murmur; Pre-eclampsia; Pregnancy induced hypertension; Preterm labor; PTSD (post-traumatic stress disorder); Sciatica; and Vaginal Pap smear, abnormal. Also,  has a past surgical history that includes Hernia repair., family history includes Anxiety disorder in her brother, father, and mother; COPD in her father; Coronary artery disease in her maternal grandmother; Depression in her brother, father, and mother; Diabetes in her father, maternal grandmother, mother, and paternal grandmother; Hepatitis C in her father; Hypertension in her father, maternal grandmother, mother, and paternal grandmother; Kidney failure in her paternal grandmother; Prostate cancer in her maternal grandfather; Stroke in her mother.,  reports that she quit smoking about 4 months ago. Her smoking use included Cigarettes and Cigars. She has never used smokeless tobacco. She reports that she does not drink alcohol or use drugs. She has a current medication list which includes the following prescription(s): acetaminophen, albuterol, cyclobenzaprine, prenatal 19, ranitidine, and sertraline. Also, is allergic to latex.                 OB History  Gravida Para Term Preterm AB Living  4 3 1 2   3   SAB TAB Ectopic Multiple Live Births        0 3    # Outcome Date GA Lbr Len/2nd Weight Sex Delivery Anes PTL Lv  4 Current           3 Term 04/10/15 [redacted]w[redacted]d 10:58 / 00:03 5 lb 14.2 oz (2.67 kg) F Vag-Spont Local N LIV  2 Preterm 03/15/14 [redacted]w[redacted]d  5 lb (2.268 kg) F Vag-Spont  Y  LIV     Complications: Preeclampsia  1 Preterm 04/16/06 [redacted]w[redacted]d  5 lb 12 oz (2.608 kg) F Vag-Spont None Y LIV    Patient denies any other pertinent gynecologic issues.   Review of Systems  Constitutional: Negative for chills, fever and malaise/fatigue.  HENT: Negative for congestion, sinus pain and sore throat.   Eyes: Negative for blurred vision and pain.  Respiratory: Negative for cough and wheezing.   Cardiovascular: Negative for chest pain and leg swelling.  Gastrointestinal: Negative for abdominal pain, constipation, diarrhea, heartburn, nausea and vomiting.  Genitourinary: Negative for dysuria, frequency, hematuria and urgency.  Musculoskeletal: Negative for back pain, joint pain, myalgias and neck pain.  Skin: Negative for itching and rash.  Neurological: Negative for dizziness, tremors and weakness.  Endo/Heme/Allergies: Does not bruise/bleed easily.  Psychiatric/Behavioral: Negative for depression. The patient is not nervous/anxious and does not have insomnia.     Objective:  BP 114/66   Wt 234 lb (106.1 kg)   LMP 03/27/2016   BMI 38.94 kg/m  Prenatal Labs: O positive, MMRx2, Varicella Immune, GBS neg 7/20, panel neg  Physical Exam  Vitals reviewed. Physical examination Constitutional NAD, Conversant  Skin No rashes, lesions or ulceration. Normal palpated skin turgor. No nodularity.  Lungs: Clear to auscultation.No rales or wheezes. Normal Respiratory effort, no retractions.  Heart: NSR.  No murmurs or rubs appreciated. No periferal edema  Abdomen: Gravid.  Non-tender.  No masses.  No HSM. No hernia  Extremities: Moves all appropriately.  Normal ROM for age. No lymphadenopathy.  Neuro: Grossly intact  Psych: Oriented to PPT.  Normal mood. Normal affect.             Pelvic:    Vulva: Normal appearance.  No lesions.    Vagina: No lesions or abnormalities noted.    Urethra No masses tenderness or scarring.    Meatus Normal size without lesions or prolapse.     Cervix: 2/50/-3.     Perineum: Normal exam.  No lesions.          Bimanual    Uterus: Enlarged.  Non-tender.    Adnexae: Not palpated.  Cul-de-sac: Negative for abnormality.   Assessment: Term Pregnancy for Induction of Labor due to Favorable cervix at term.  Plan: Patient will undergo induction of labor with cervical ripening agents.     Patient has been fully informed of the pros and cons, risks and benefits of continued observation with fetal monitoring versus that of induction of labor.   She understands that there are uncommon risks to induction, which include but are not limited to : frequent or prolonged uterine contractions, fetal distress, uterine rupture, and lack of successful induction.  These risks include all methods including Pitocin and Misoprostol and Cervadil.  Patient understands that using Misoprostol for labor induction is an "off label" indication although it has been studied extensively for this purpose and is an accepted method of induction.  She also has been informed of the increased risks for Cesarean with induction and should induction not be successful.  Patient consents to the induction plan of management.  Plans to bottle feed Plans IUD for contraception  Tresea MallJane Clydia Nieves, CNM

## 2016-12-26 NOTE — Progress Notes (Signed)
  Labor Progress Note   31 y.o. [id number] @ 3577w0d , admitted for  Pregnancy, Labor Management.   Subjective:  Mod ctx pains AROM clear and abundant  Objective:  BP 130/75   Pulse 80   Temp 98.1 F (36.7 C) (Oral)   Resp 18   Ht 5\' 5"  (1.651 m)   Wt 230 lb (104.3 kg)   LMP 03/27/2016   BMI 38.27 kg/m  Abd: mild Extr: trace to 1+ bilateral pedal edema SVE: CERVIX: 5 cm dilated, 70 effaced, -3 station  EFM: FHR: 140 bpm, variability: moderate,  accelerations:  Present,  decelerations:  Absent Toco: reg ctxs q 2-3 min Labs: I have reviewed the patient's lab results.  Assessment & Plan:  [id number] @ 7677w0d, admitted for  Pregnancy and Labor/Delivery Management  1. Pain management: none. 2. FWB: FHT category 1.  3. ID: GBS negative 4. Labor management: Pitocin, AROM done, anticipate second stage soon  All discussed with patient, see orders  Annamarie MajorPaul Joedy Eickhoff, MD, Merlinda FrederickFACOG [facility], [facility] 12/26/2016  10:25 PM

## 2016-12-26 NOTE — Plan of Care (Signed)
Problem: Education: Goal: Knowledge of Childbirth will improve Outcome: Progressing Pitocin started to augment labor following Cytotec x2 doses. AROM, clear fluid, cervical changes noted, now 6cm and regular labor contractions. IV pain med requested and given. Pt declines offer for Epidural.

## 2016-12-27 DIAGNOSIS — Z3A39 39 weeks gestation of pregnancy: Secondary | ICD-10-CM

## 2016-12-27 DIAGNOSIS — O9952 Diseases of the respiratory system complicating childbirth: Secondary | ICD-10-CM

## 2016-12-27 LAB — CBC
HEMATOCRIT: 30 % — AB (ref 35.0–47.0)
HEMOGLOBIN: 9.6 g/dL — AB (ref 12.0–16.0)
MCH: 25.7 pg — AB (ref 26.0–34.0)
MCHC: 32 g/dL (ref 32.0–36.0)
MCV: 80.3 fL (ref 80.0–100.0)
Platelets: 199 10*3/uL (ref 150–440)
RBC: 3.74 MIL/uL — ABNORMAL LOW (ref 3.80–5.20)
RDW: 15.5 % — AB (ref 11.5–14.5)
WBC: 15.5 10*3/uL — ABNORMAL HIGH (ref 3.6–11.0)

## 2016-12-27 LAB — RPR: RPR: NONREACTIVE

## 2016-12-27 MED ORDER — SERTRALINE HCL 25 MG PO TABS
50.0000 mg | ORAL_TABLET | Freq: Every day | ORAL | Status: DC
  Filled 2016-12-27: qty 2

## 2016-12-27 MED ORDER — IBUPROFEN 600 MG PO TABS
ORAL_TABLET | ORAL | Status: AC
  Administered 2016-12-27: 600 mg via ORAL
  Filled 2016-12-27: qty 1

## 2016-12-27 MED ORDER — ACETAMINOPHEN 325 MG PO TABS
650.0000 mg | ORAL_TABLET | ORAL | Status: DC | PRN
  Administered 2016-12-27: 650 mg via ORAL
  Filled 2016-12-27: qty 2

## 2016-12-27 MED ORDER — LIDOCAINE HCL (PF) 1 % IJ SOLN
30.0000 mL | Freq: Once | INTRAMUSCULAR | Status: AC
  Administered 2016-12-27: 30 mL

## 2016-12-27 MED ORDER — ALBUTEROL SULFATE (2.5 MG/3ML) 0.083% IN NEBU: 3.0000 mL | INHALATION_SOLUTION | Freq: Four times a day (QID) | RESPIRATORY_TRACT | Status: DC

## 2016-12-27 MED ORDER — BENZOCAINE-MENTHOL 20-0.5 % EX AERO
1.0000 "application " | INHALATION_SPRAY | CUTANEOUS | Status: DC | PRN
  Administered 2016-12-27: 1 via TOPICAL
  Filled 2016-12-27 (×2): qty 56

## 2016-12-27 MED ORDER — SODIUM CHLORIDE 0.9% FLUSH: 3.0000 mL | Freq: Two times a day (BID) | INTRAVENOUS | Status: DC

## 2016-12-27 MED ORDER — OXYCODONE-ACETAMINOPHEN 5-325 MG PO TABS
1.0000 | ORAL_TABLET | ORAL | Status: DC | PRN
  Administered 2016-12-27: 1 via ORAL
  Filled 2016-12-27: qty 1

## 2016-12-27 MED ORDER — SODIUM CHLORIDE 0.9% FLUSH
3.0000 mL | INTRAVENOUS | Status: DC | PRN
  Administered 2016-12-26 – 2016-12-27 (×2): 3 mL via INTRAVENOUS
  Filled 2016-12-27 (×2): qty 3

## 2016-12-27 MED ORDER — ONDANSETRON HCL 4 MG/2ML IJ SOLN: 4.0000 mg | INTRAMUSCULAR | Status: DC | PRN

## 2016-12-27 MED ORDER — ALBUTEROL SULFATE (2.5 MG/3ML) 0.083% IN NEBU: 3.0000 mL | INHALATION_SOLUTION | Freq: Four times a day (QID) | RESPIRATORY_TRACT | Status: DC | PRN

## 2016-12-27 MED ORDER — WITCH HAZEL-GLYCERIN EX PADS
1.0000 "application " | MEDICATED_PAD | CUTANEOUS | Status: DC | PRN
  Filled 2016-12-27: qty 100

## 2016-12-27 MED ORDER — ONDANSETRON HCL 4 MG PO TABS
4.0000 mg | ORAL_TABLET | ORAL | Status: DC | PRN
  Administered 2016-12-27 – 2016-12-28 (×2): 4 mg via ORAL
  Filled 2016-12-27 (×3): qty 1

## 2016-12-27 MED ORDER — FAMOTIDINE 20 MG PO TABS
20.0000 mg | ORAL_TABLET | Freq: Two times a day (BID) | ORAL | Status: DC | PRN
  Administered 2016-12-27: 20 mg via ORAL
  Filled 2016-12-27: qty 1

## 2016-12-27 MED ORDER — SIMETHICONE 80 MG PO CHEW
80.0000 mg | CHEWABLE_TABLET | ORAL | Status: DC | PRN
  Administered 2016-12-27: 80 mg via ORAL
  Filled 2016-12-27: qty 1

## 2016-12-27 MED ORDER — DIBUCAINE 1 % RE OINT
1.0000 "application " | TOPICAL_OINTMENT | RECTAL | Status: DC | PRN
  Filled 2016-12-27: qty 28

## 2016-12-27 MED ORDER — SENNOSIDES-DOCUSATE SODIUM 8.6-50 MG PO TABS
2.0000 | ORAL_TABLET | ORAL | Status: DC
  Administered 2016-12-28: 2 via ORAL
  Filled 2016-12-27: qty 2

## 2016-12-27 MED ORDER — OXYCODONE-ACETAMINOPHEN 5-325 MG PO TABS
2.0000 | ORAL_TABLET | ORAL | Status: DC | PRN
  Administered 2016-12-27 – 2016-12-28 (×6): 2 via ORAL
  Filled 2016-12-27 (×6): qty 2

## 2016-12-27 MED ORDER — SODIUM CHLORIDE 0.9 % IV SOLN: 250.0000 mL | INTRAVENOUS | Status: DC | PRN

## 2016-12-27 MED ORDER — FENTANYL 2.5 MCG/ML W/ROPIVACAINE 0.15% IN NS 100 ML EPIDURAL (ARMC)
EPIDURAL | Status: AC
  Filled 2016-12-27: qty 100

## 2016-12-27 MED ORDER — COCONUT OIL OIL: 1.0000 "application " | TOPICAL_OIL | Status: DC | PRN

## 2016-12-27 MED ORDER — IBUPROFEN 600 MG PO TABS
600.0000 mg | ORAL_TABLET | Freq: Four times a day (QID) | ORAL | Status: DC
  Administered 2016-12-27 – 2016-12-28 (×6): 600 mg via ORAL
  Filled 2016-12-27 (×5): qty 1

## 2016-12-27 MED ORDER — ZOLPIDEM TARTRATE 5 MG PO TABS: 5.0000 mg | ORAL_TABLET | Freq: Every evening | ORAL | Status: DC | PRN

## 2016-12-27 MED ORDER — DIPHENHYDRAMINE HCL 25 MG PO CAPS: 25.0000 mg | ORAL_CAPSULE | Freq: Four times a day (QID) | ORAL | Status: DC | PRN

## 2016-12-27 NOTE — Discharge Instructions (Signed)

## 2016-12-27 NOTE — Discharge Summary (Signed)
OB Discharge Summary     Patient Name: Robin Myers DOB: 1986-01-27 MRN: [id number]  Date of admission: 12/26/2016 Delivering MD: Letitia Libra, MD  Date of Delivery: 12/26/2016  Date of discharge: 12/28/2016  Admitting diagnosis: induction Intrauterine pregnancy: [redacted]w[redacted]d     Secondary diagnosis: None     Discharge diagnosis: Term Pregnancy Delivered, No other diagnosis                         Hospital course:  Induction of Labor With Vaginal Delivery   31 y.o. yo [phone] at [redacted]w[redacted]d was admitted to the hospital 12/26/2016 for induction of labor.  Indication for induction: Favorable cervix at term.  Patient had an uncomplicated labor course as follows: Membrane Rupture Time/Date: 10:21 PM ,12/26/2016   Intrapartum Procedures: Episiotomy: None [1]                                         Lacerations:  2nd degree [3]  Patient had delivery of a Viable infant.  Information for the patient's newborn:  Karmah, [place]ocki [[id number]]  Delivery Method: Vag-Spont  Delivery Note Primary [facility] Physician: Annamarie Major, MD Gestational Age: Full term Antepartum complications: none Intrapartum complications: None  A viable Female was delivered via vertex perentation.  Apgars:8 ,9  Weight:  7 lb 13 oz .   Placenta status: spontaneous and Intact.  Cord: 3+ vessels;  with the following complications: none.  Anesthesia:  none Episiotomy:  none Lacerations:  2nd Suture Repair: 2.0 Est. Blood Loss (mL):  less than 100 mL  Mom to postpartum.  Baby to Couplet care / Skin to Skin. 12/27/2016  Details of delivery can be found in separate delivery note.  Patient had a routine postpartum course. Patient is discharged home 12/28/16.                                                                 Post partum procedures:none  Complications: None  Physical exam on 12/28/2016: Vitals:   12/27/16 1644 12/27/16 2102 12/28/16 0148 12/28/16 0725  BP: 118/62 117/68 120/60 120/62  Pulse: 63  73 74 78  Resp: 17 18 18 19   Temp: 98 F (36.7 C) 98 F (36.7 C) 98.2 F (36.8 C) (!) 97.3 F (36.3 C)  TempSrc: Oral Oral Oral Oral  SpO2:  100% 100% 100%  Weight:      Height:       General: alert, cooperative and no distress Lochia: appropriate Uterine Fundus: firm Incision: N/A DVT Evaluation: No evidence of DVT seen on physical exam. No cords or calf tenderness. No significant calf/ankle edema.  Labs: Lab Results  Component Value Date   WBC 15.5 (H) 12/27/2016   HGB 9.6 (L) 12/27/2016   HCT 30.0 (L) 12/27/2016   MCV 80.3 12/27/2016   PLT 199 12/27/2016   CMP Latest Ref Rng & Units 08/28/2016  Glucose 65 - 99 mg/dL 130(Q)  BUN 6 - 20 mg/dL 11  Creatinine 6.57 - 8.46 mg/dL 9.62  Sodium 952 - 841 mmol/L 135  Potassium 3.5 - 5.1 mmol/L 3.4(L)  Chloride 101 - 111 mmol/L 107  CO2  22 - 32 mmol/L 21(L)  Calcium 8.9 - 10.3 mg/dL 1.6(X8.5(L)  Total Protein 6.5 - 8.1 g/dL 6.2(L)  Total Bilirubin 0.3 - 1.2 mg/dL 0.9(U0.2(L)  Alkaline Phos 38 - 126 U/L 86  AST 15 - 41 U/L 16  ALT 14 - 54 U/L 8(L)    Discharge instruction: per After Visit Summary.  Medications:  Allergies as of 12/28/2016      Reactions   Latex    Onion Swelling      Medication List    TAKE these medications   acetaminophen 325 MG tablet Commonly known as:  TYLENOL Take 650 mg by mouth as needed.   albuterol 108 (90 Base) MCG/ACT inhaler Commonly known as:  PROVENTIL HFA;VENTOLIN HFA Inhale 2 puffs into the lungs every 6 (six) hours as needed for wheezing or shortness of breath.   calcium carbonate 750 MG chewable tablet Commonly known as:  TUMS EX Chew 1 tablet by mouth daily.   cyclobenzaprine 5 MG tablet Commonly known as:  FLEXERIL Take 1 tablet (5 mg total) by mouth 3 (three) times daily as needed for muscle spasms. Can take 2 at hs for sleep   ibuprofen 600 MG tablet Commonly known as:  ADVIL,MOTRIN Take 1 tablet (600 mg total) by mouth every 6 (six) hours.   PRENATAL 19 tablet Take 1  tablet by mouth daily.   ranitidine 300 MG tablet Commonly known as:  ZANTAC Take 0.5 tablets (150 mg total) by mouth 2 (two) times daily as needed for heartburn.   sertraline 50 MG tablet Commonly known as:  ZOLOFT Take 1/2 tablet daily x 1 week then increase to one tablet daily       Diet: routine diet  Activity: Advance as tolerated. Pelvic rest for 6 weeks.   Outpatient follow up: Follow-up Information    Nadara MustardHarris, Robert P, MD. Schedule an appointment as soon as possible for a visit in 2 week(s).   Specialty:  Obstetrics and Gynecology Why:  postpartum depression check/medication check for chronic pain Contact information: [address] [place] [address] [phone]             Postpartum contraception: IUD Mirena Rhogam Given postpartum: no Rubella vaccine given postpartum: no Varicella vaccine given postpartum: no TDaP given antepartum or postpartum: Yes  Newborn Data: Live born female  Birth Weight: 7 lb 12.5 oz (3530 g) APGAR: 8, 9   Baby Feeding: Bottle  Disposition:home with mother  SIGNED: Thomasene MohairStephen Starlit Raburn, MD 12/28/2016 10:20 AM

## 2016-12-28 MED ORDER — IBUPROFEN 600 MG PO TABS: 600.0000 mg | ORAL_TABLET | Freq: Four times a day (QID) | ORAL | 0 refills | Status: DC

## 2016-12-28 NOTE — Progress Notes (Signed)
Discharge instructions complete and prescriptions given. Patient verbalizes understanding of teaching. Patient discharged home at 1138.

## 2016-12-29 ENCOUNTER — Telehealth: Payer: Self-pay

## 2016-12-29 NOTE — Telephone Encounter (Signed)
Monitor and see how this goes, not uncommon to have varying aches, pains, and even nausea.  Offer appt if she is still having symptoms later this week, or anytime if escalating

## 2016-12-29 NOTE — Telephone Encounter (Signed)
Pt delivered 12/27/16 and discharged yesterday. Pt c/o persistent and constant pain in right leg and abdomen as well as nausea. Please advise. Thank you. Cb# [phone]

## 2016-12-30 NOTE — Telephone Encounter (Signed)
Pt set up appt for Thursday.

## 2017-01-01 ENCOUNTER — Encounter: Payer: Medicare Other | Admitting: Obstetrics and Gynecology

## 2017-01-01 ENCOUNTER — Ambulatory Visit (INDEPENDENT_AMBULATORY_CARE_PROVIDER_SITE_OTHER): Payer: Medicare Other | Admitting: Obstetrics and Gynecology

## 2017-01-01 ENCOUNTER — Encounter: Payer: Self-pay | Admitting: Obstetrics and Gynecology

## 2017-01-01 MED ORDER — TRAMADOL HCL 50 MG PO TABS: 50.0000 mg | ORAL_TABLET | Freq: Four times a day (QID) | ORAL | 0 refills | Status: DC | PRN
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
## 2017-01-06 ENCOUNTER — Other Ambulatory Visit: Payer: Self-pay | Admitting: Certified Nurse Midwife

## 2017-01-06 NOTE — Telephone Encounter (Signed)
Please advise for refill. Thank you.  

## 2017-01-26 IMAGING — CR DG CHEST 2V
2 series · 2 of 2 positions shown · non-contrast
Comparison: 06/02/2013

CLINICAL DATA: Nonproductive cough and fever. Vomiting and
diarrhea.

EXAM:
CHEST  2 VIEW

[chest pa]
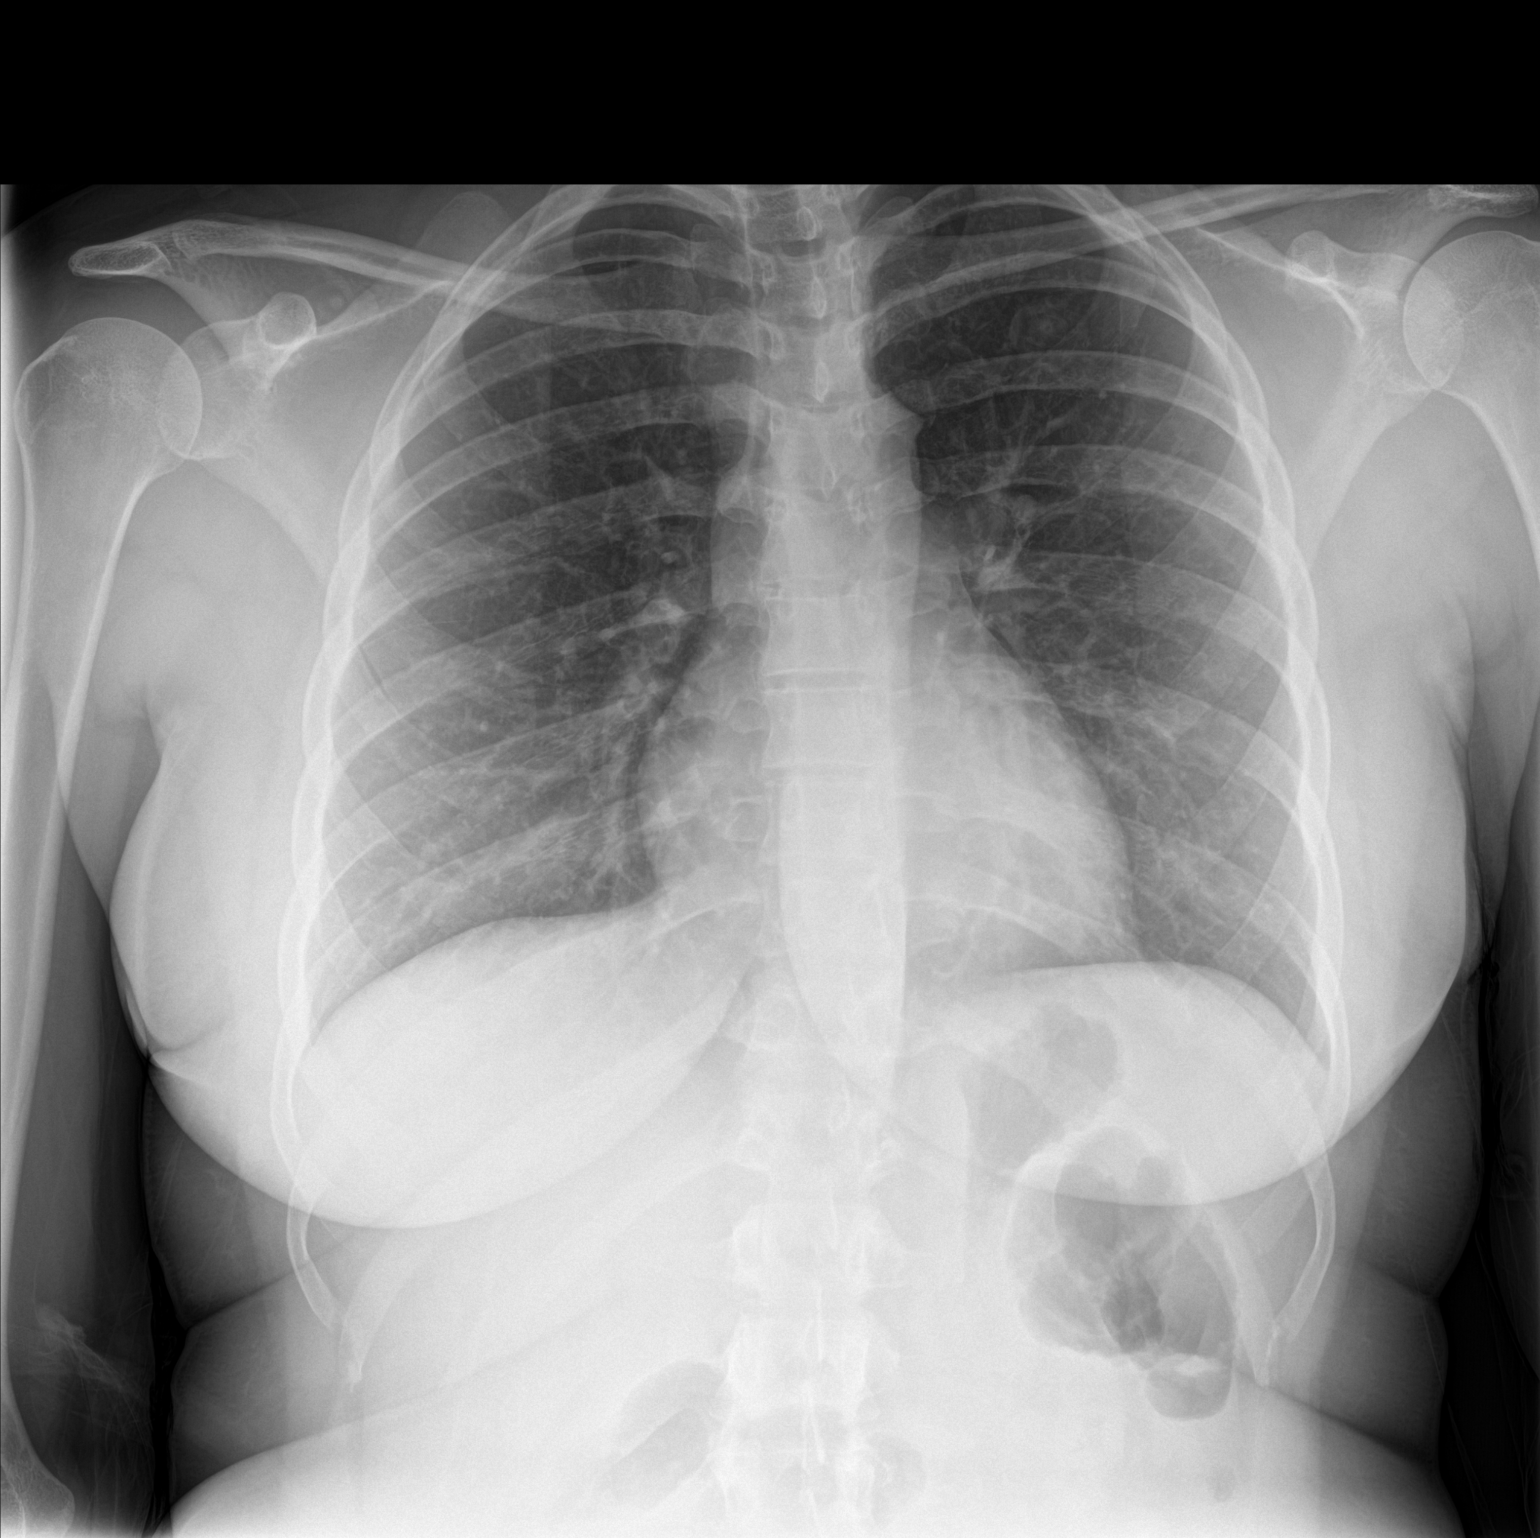

[chest lat]
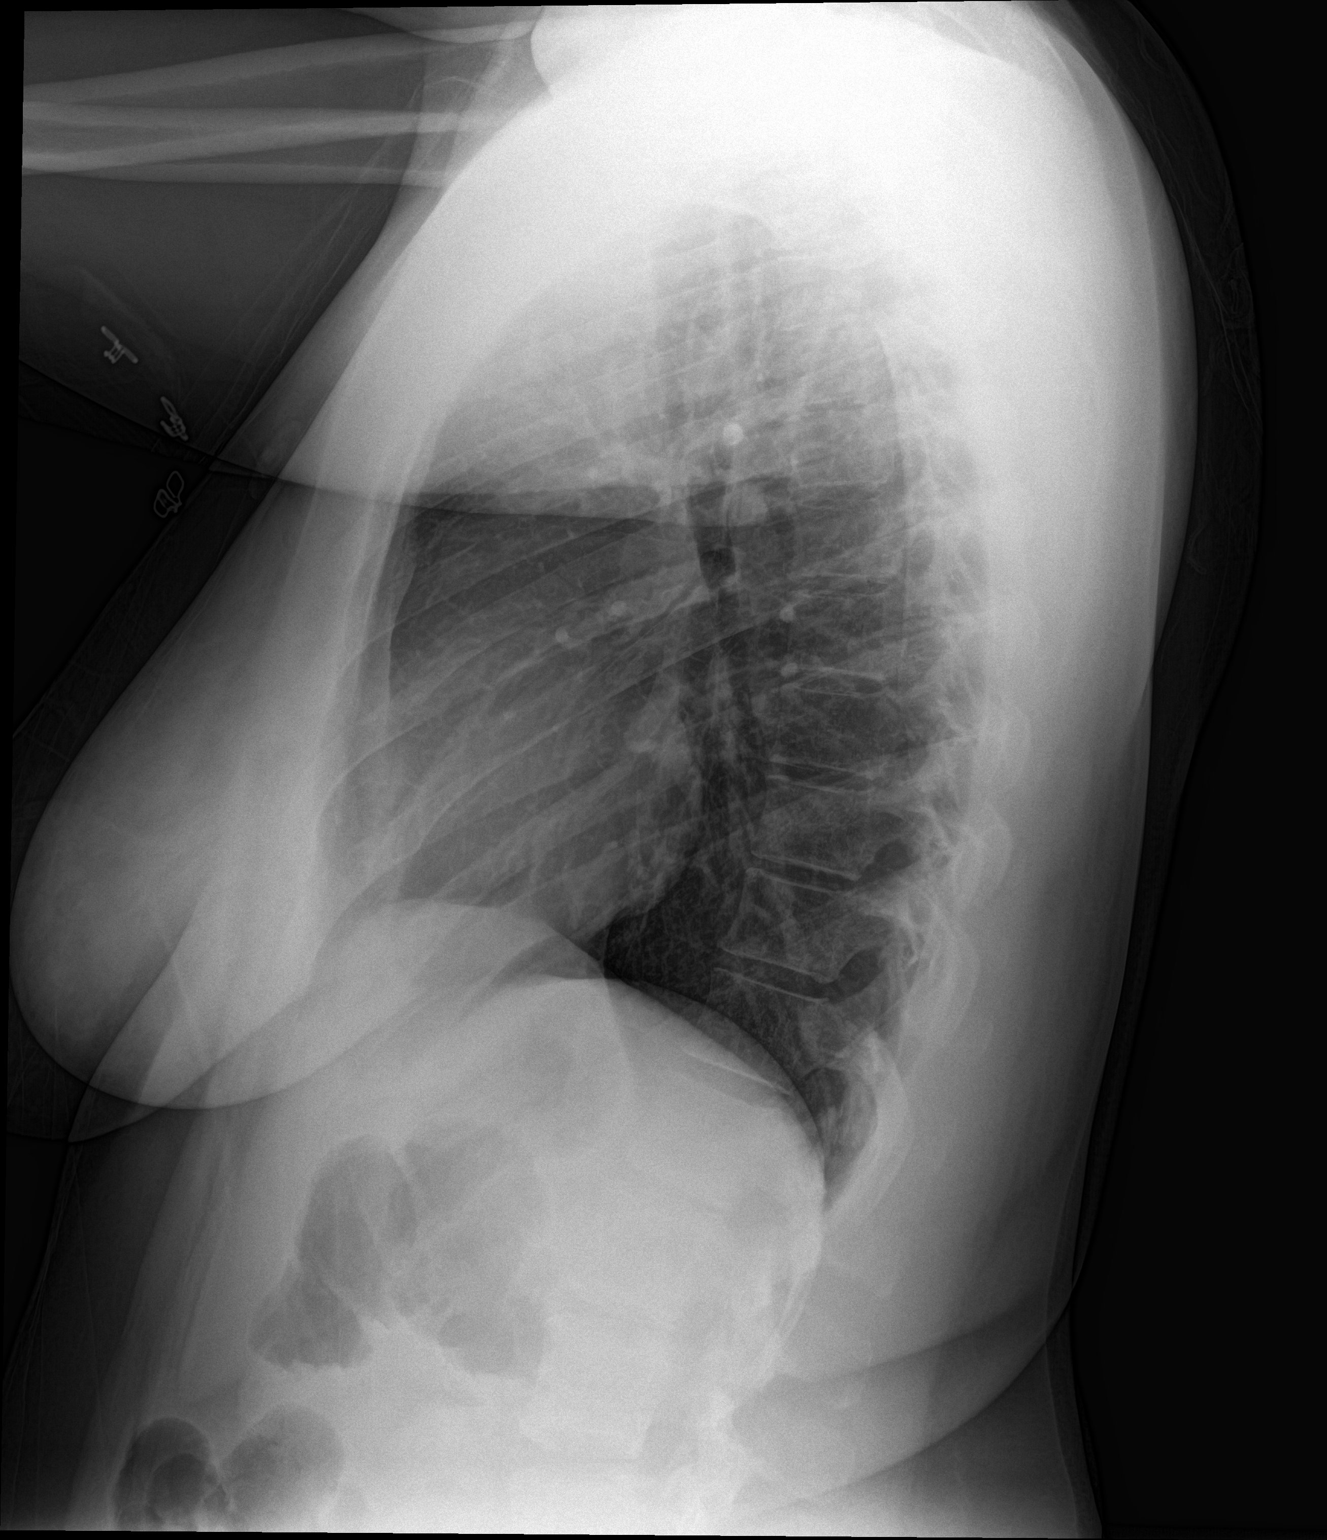

[2 of 2 positions shown; findings below may reference images not displayed]

FINDINGS: The heart size and mediastinal contours are within normal limits.
Both lungs are clear. The visualized skeletal structures are
unremarkable.
IMPRESSION: No active cardiopulmonary disease.

## 2017-02-05 ENCOUNTER — Ambulatory Visit: Payer: Medicare Other | Admitting: Obstetrics & Gynecology

## 2017-02-25 ENCOUNTER — Encounter: Payer: Self-pay | Admitting: Obstetrics & Gynecology

## 2017-02-25 ENCOUNTER — Ambulatory Visit (INDEPENDENT_AMBULATORY_CARE_PROVIDER_SITE_OTHER): Payer: Medicare Other | Admitting: Obstetrics & Gynecology

## 2017-02-25 VITALS — BP 130/90 | HR 51 | Ht 65.0 in | Wt 205.0 lb

## 2017-02-25 DIAGNOSIS — Z30430 Encounter for insertion of intrauterine contraceptive device: Secondary | ICD-10-CM | POA: Diagnosis not present

## 2017-02-25 DIAGNOSIS — F331 Major depressive disorder, recurrent, moderate: Secondary | ICD-10-CM

## 2017-02-25 DIAGNOSIS — Z3049 Encounter for surveillance of other contraceptives: Secondary | ICD-10-CM

## 2017-02-25 MED ORDER — BUPROPION HCL ER (XL) 300 MG PO TB24: 300.0000 mg | ORAL_TABLET | Freq: Every day | ORAL | 6 refills | Status: DC

## 2017-02-25 NOTE — Progress Notes (Signed)
  OBSTETRICS POSTPARTUM CLINIC PROGRESS NOTE  Subjective:     Robin Myers is a 31 y.o. [phone] female who presents for a postpartum visit. She is 8 weeks postpartum following a Term pregnancy and delivery by Vaginal, no problems at delivery.  I have fully reviewed the prenatal and intrapartum course. Anesthesia: none.  Postpartum course has been complicated by uncomplicated.  Baby is feeding by Bottle.  Bleeding: patient has  resumed menses. LMP 9/18-30. Bowel function is normal. Bladder function is normal.  Patient is sexually active. Contraception method desired is IUD.  Postpartum depression screening: positive. [place] 12. Has been on Zoloft but not completely therapeutic.  Recent stress with mother in hospital.  The following portions of the patient's history were reviewed and updated as appropriate: allergies, current medications, past family history, past medical history, past social history, past surgical history and problem list.  Review of Systems Pertinent items are noted in HPI.  Objective:    BP 130/90   Pulse (!) 51   Ht  (1.651 m)   Wt 205 lb (93 kg)   LMP 02/03/2017   BMI 34.11 kg/m   General:  alert and no distress   Breasts:  inspection negative, no nipple discharge or bleeding, no masses or nodularity palpable  Lungs: clear to auscultation bilaterally  Heart:  regular rate and rhythm, S1, S2 normal, no murmur, click, rub or gallop  Abdomen: soft, non-tender; bowel sounds normal; no masses,  no organomegaly.     Vulva:  normal  Vagina: normal vagina, no discharge, exudate, lesion, or erythema  Cervix:  no cervical motion tenderness and no lesions  Corpus: normal size, contour, position, consistency, mobility, non-tender  Adnexa:  normal adnexa and no mass, fullness, tenderness  Rectal Exam: Not performed.         IUD PROCEDURE NOTE:  Robin Myers is a 31 y.o. [phone] here for IUD insertion. No GYN concerns.  Last pap smear was normal.  IUD  Insertion Procedure Note Patient identified, informed consent performed, consent signed.   Discussed risks of irregular bleeding, cramping, infection, malpositioning or misplacement of the IUD outside the uterus which may require further procedure such as laparoscopy, risk of failure <1%. Time out was performed.  Urine pregnancy test negative.  A bimanual exam showed the uterus to be midposition.  Speculum placed in the vagina.  Cervix visualized.  Cleaned with Betadine x 2.  Grasped anteriorly with a single tooth tenaculum.  Uterus sounded to 8 cm.   IUD placed per manufacturer's recommendations.  Strings trimmed to 3 cm. Tenaculum was removed, good hemostasis noted.  Patient tolerated procedure well.   Patient was given post-procedure instructions.  She was advised to have backup contraception for one week.  Patient was also asked to check IUD strings periodically and follow up in 4 weeks for IUD check.  Assessment:  Post Partum Care visit 1. Moderate episode of recurrent major depressive disorder (HCC) - Wellbutrin 300 mg Rx and info provided today - Counseling as option also  2. Postpartum care and examination - Pap IG (Image Guided)  3. Encounter for surveillance of other contraceptive - IUD today w f/u one month  Plan:  See orders and Patient Instructions Contraceptive counseling for IUD Resume all normal activities Follow up in: 4 weeks or as needed.   Annamarie Major, MD, [facility], [facility] 02/25/2017  4:06 PM

## 2017-02-25 NOTE — Patient Instructions (Signed)
       Bupropion tablets (Depression/Mood Disorders) What is this medicine? BUPROPION (byoo PROE pee on) is used to treat depression. This medicine may be used for other purposes; ask your health care provider or pharmacist if you have questions. COMMON BRAND NAME(S): Wellbutrin What should I tell my health care provider before I take this medicine? They need to know if you have any of these conditions: -an eating disorder, such as anorexia or bulimia -bipolar disorder or psychosis -diabetes or high blood sugar, treated with medication -glaucoma -heart disease, previous heart attack, or irregular heart beat -head injury or brain tumor -high blood pressure -kidney or liver disease -seizures -suicidal thoughts or a previous suicide attempt -Tourette's syndrome -weight loss -an unusual or allergic reaction to bupropion, other medicines, foods, dyes, or preservatives -breast-feeding -pregnant or trying to become pregnant How should I use this medicine? Take this medicine by mouth with a glass of water. Follow the directions on the prescription label. You can take it with or without food. If it upsets your stomach, take it with food. Take your medicine at regular intervals. Do not take your medicine more often than directed. Do not stop taking this medicine suddenly except upon the advice of your doctor. Stopping this medicine too quickly may cause serious side effects or your condition may worsen. A special MedGuide will be given to you by the pharmacist with each prescription and refill. Be sure to read this information carefully each time. Talk to your pediatrician regarding the use of this medicine in children. Special care may be needed. Overdosage: If you think you have taken too much of this medicine contact a poison control center or emergency room at once. NOTE: This medicine is only for you. Do not share this medicine with others. What if I miss a dose? If you miss a dose,  take it as soon as you can. If it is less than four hours to your next dose, take only that dose and skip the missed dose. Do not take double or extra doses. What may interact with this medicine? Do not take this medicine with any of the following medications: -linezolid -MAOIs like Azilect, Carbex, Eldepryl, Marplan, Nardil, and Parnate -methylene blue (injected into a vein) -other medicines that contain bupropion like Zyban This medicine may also interact with the following medications: -alcohol -certain medicines for anxiety or sleep -certain medicines for blood pressure like metoprolol, propranolol -certain medicines for depression or psychotic disturbances -certain medicines for HIV or AIDS like efavirenz, lopinavir, nelfinavir, ritonavir -certain medicines for irregular heart beat like propafenone, flecainide -certain medicines for Parkinson's disease like amantadine, levodopa -certain medicines for seizures like carbamazepine, phenytoin, phenobarbital -cimetidine -clopidogrel -cyclophosphamide -digoxin -furazolidone -isoniazid -nicotine -orphenadrine -procarbazine -steroid medicines like prednisone or cortisone -stimulant medicines for attention disorders, weight loss, or to stay awake -tamoxifen -theophylline -thiotepa -ticlopidine -tramadol -warfarin This list may not describe all possible interactions. Give your health care provider a list of all the medicines, herbs, non-prescription drugs, or dietary supplements you use. Also tell them if you smoke, drink alcohol, or use illegal drugs. Some items may interact with your medicine. What should I watch for while using this medicine? Tell your doctor if your symptoms do not get better or if they get worse. Visit your doctor or health care professional for regular checks on your progress. Because it may take several weeks to see the full effects of this medicine, it is important to continue your treatment as prescribed by    your doctor. Patients and their families should watch out for new or worsening thoughts of suicide or depression. Also watch out for sudden changes in feelings such as feeling anxious, agitated, panicky, irritable, hostile, aggressive, impulsive, severely restless, overly excited and hyperactive, or not being able to sleep. If this happens, especially at the beginning of treatment or after a change in dose, call your health care professional. Avoid alcoholic drinks while taking this medicine. Drinking excessive alcoholic beverages, using sleeping or anxiety medicines, or quickly stopping the use of these agents while taking this medicine may increase your risk for a seizure. Do not drive or use heavy machinery until you know how this medicine affects you. This medicine can impair your ability to perform these tasks. Do not take this medicine close to bedtime. It may prevent you from sleeping. Your mouth may get dry. Chewing sugarless gum or sucking hard candy, and drinking plenty of water may help. Contact your doctor if the problem does not go away or is severe. What side effects may I notice from receiving this medicine? Side effects that you should report to your doctor or health care professional as soon as possible: -allergic reactions like skin rash, itching or hives, swelling of the face, lips, or tongue -breathing problems -changes in vision -confusion -elevated mood, decreased need for sleep, racing thoughts, impulsive behavior -fast or irregular heartbeat -hallucinations, loss of contact with reality -increased blood pressure -redness, blistering, peeling or loosening of the skin, including inside the mouth -seizures -suicidal thoughts or other mood changes -unusually weak or tired -vomiting Side effects that usually do not require medical attention (report to your doctor or health care professional if they continue or are bothersome): -constipation -headache -loss of  appetite -nausea -tremors -weight loss This list may not describe all possible side effects. Call your doctor for medical advice about side effects. You may report side effects to FDA at 1-800-FDA-1088. Where should I keep my medicine? Keep out of the reach of children. Store at room temperature between 20 and 25 degrees C (68 and 77 degrees F), away from direct sunlight and moisture. Keep tightly closed. Throw away any unused medicine after the expiration date. NOTE: This sheet is a summary. It may not cover all possible information. If you have questions about this medicine, talk to your doctor, pharmacist, or health care provider.  2018 Elsevier/Gold Standard (2015-10-26 13:44:21)  

## 2017-02-27 LAB — PAP IG (IMAGE GUIDED): PAP Smear Comment: 0

## 2017-03-10 ENCOUNTER — Telehealth: Payer: Self-pay

## 2017-03-10 NOTE — Telephone Encounter (Signed)
Pt had IUD placed 12 days ago & is still bleeding. Inquiring if normal.

## 2017-03-10 NOTE — Telephone Encounter (Signed)
Spoke w/pt. Notfied normal to have irregular bleeding for ~6 wks s/p IUD insertions. Advised to monitor for excessive bleeding requiring need to change pad more than 1x/qhr d/t complete soaking. Pt aware & will call w/any further questions/concerns.

## 2017-03-27 ENCOUNTER — Ambulatory Visit: Payer: Medicare Other | Admitting: Obstetrics & Gynecology

## 2017-04-14 ENCOUNTER — Other Ambulatory Visit: Payer: Self-pay | Admitting: Internal Medicine

## 2017-04-14 ENCOUNTER — Ambulatory Visit: Payer: Medicare Other | Admitting: Obstetrics and Gynecology

## 2017-04-14 DIAGNOSIS — R945 Abnormal results of liver function studies: Secondary | ICD-10-CM

## 2017-04-15 ENCOUNTER — Other Ambulatory Visit: Payer: Self-pay | Admitting: Internal Medicine

## 2017-04-15 DIAGNOSIS — R944 Abnormal results of kidney function studies: Secondary | ICD-10-CM

## 2017-04-16 ENCOUNTER — Other Ambulatory Visit (INDEPENDENT_AMBULATORY_CARE_PROVIDER_SITE_OTHER): Payer: Self-pay

## 2017-04-16 ENCOUNTER — Other Ambulatory Visit: Payer: Self-pay | Admitting: Certified Nurse Midwife

## 2017-04-16 MED ORDER — ALBUTEROL SULFATE HFA 108 (90 BASE) MCG/ACT IN AERS
2.0000 | INHALATION_SPRAY | Freq: Four times a day (QID) | RESPIRATORY_TRACT | 0 refills | Status: DC | PRN
Start: 2017-04-16 — End: 2017-06-11

## 2017-04-16 NOTE — Telephone Encounter (Signed)
Since patient is no longer pregnant do you want to refill or have her get from a PCP? Thank you

## 2017-04-21 ENCOUNTER — Ambulatory Visit: Payer: Medicare Other

## 2017-04-24 ENCOUNTER — Ambulatory Visit: Payer: Medicare Other | Admitting: Obstetrics and Gynecology

## 2017-06-11 ENCOUNTER — Other Ambulatory Visit: Payer: Self-pay | Admitting: Certified Nurse Midwife

## 2017-08-14 ENCOUNTER — Emergency Department
Admission: EM | Admit: 2017-08-14 | Discharge: 2017-08-14 | Disposition: A | Discharge status: Home / Self Care | Payer: Medicare Other | Attending: Emergency Medicine | Admitting: Emergency Medicine

## 2017-08-14 DIAGNOSIS — Z79899 Other long term (current) drug therapy: Secondary | ICD-10-CM | POA: Insufficient documentation

## 2017-08-14 DIAGNOSIS — J069 Acute upper respiratory infection, unspecified: Secondary | ICD-10-CM | POA: Diagnosis not present

## 2017-08-14 DIAGNOSIS — R070 Pain in throat: Secondary | ICD-10-CM | POA: Diagnosis present

## 2017-08-14 DIAGNOSIS — Z87891 Personal history of nicotine dependence: Secondary | ICD-10-CM | POA: Diagnosis not present

## 2017-08-14 DIAGNOSIS — J45909 Unspecified asthma, uncomplicated: Secondary | ICD-10-CM | POA: Insufficient documentation

## 2017-08-14 LAB — INFLUENZA PANEL BY PCR (TYPE A & B)
INFLAPCR: NEGATIVE
Influenza B By PCR: NEGATIVE

## 2017-08-14 LAB — GROUP A STREP BY PCR: Group A Strep by PCR: NOT DETECTED

## 2017-08-14 MED ORDER — BUTALBITAL-APAP-CAFFEINE 50-325-40 MG PO TABS
1.0000 | ORAL_TABLET | Freq: Once | ORAL | Status: AC
  Administered 2017-08-14: 1 via ORAL
  Filled 2017-08-14: qty 1
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
## 2017-08-14 NOTE — Discharge Instructions (Signed)
Please make sure you remain well-hydrated and follow-up with your primary care physician as needed.  It is normal for you to be sick for another 3-4 days.  Return to the emergency department sooner for any issues whatsoever.  It was a pleasure to take care of you today, and thank you for coming to our emergency department.  If you have any questions or concerns before leaving please ask the nurse to grab me and I'm more than happy to go through your aftercare instructions again.  If you were prescribed any opioid pain medication today such as Norco, Vicodin, Percocet, morphine, hydrocodone, or oxycodone please make sure you do not drive when you are taking this medication as it can alter your ability to drive safely.  If you have any concerns once you are home that you are not improving or are in fact getting worse before you can make it to your follow-up appointment, please do not hesitate to call 911 and come back for further evaluation.  Merrily BrittleNeil Tyffany Waldrop, MD  Results for orders placed or performed during the hospital encounter of 08/14/17  Group A Strep by PCR  Result Value Ref Range   Group A Strep by PCR NOT DETECTED NOT DETECTED  Influenza panel by PCR (type A & B)  Result Value Ref Range   Influenza A By PCR NEGATIVE NEGATIVE   Influenza B By PCR NEGATIVE NEGATIVE

## 2017-08-14 NOTE — ED Notes (Signed)

## 2017-08-14 NOTE — ED Triage Notes (Signed)
Patient c/o sore throat, body aches, and headache. Patient reports her daughter was just recently dx with influenza.

## 2018-03-18 ENCOUNTER — Other Ambulatory Visit: Payer: Self-pay

## 2018-03-18 ENCOUNTER — Emergency Department
Admission: EM | Admit: 2018-03-18 | Discharge: 2018-03-18 | Disposition: A | Discharge status: Home / Self Care | Payer: Medicare Other | Attending: Student in an Organized Health Care Education/Training Program | Admitting: Student in an Organized Health Care Education/Training Program

## 2018-03-18 ENCOUNTER — Encounter: Payer: Self-pay | Admitting: Emergency Medicine

## 2018-03-18 DIAGNOSIS — F329 Major depressive disorder, single episode, unspecified: Secondary | ICD-10-CM | POA: Diagnosis not present

## 2018-03-18 DIAGNOSIS — Z91040 Latex allergy status: Secondary | ICD-10-CM | POA: Insufficient documentation

## 2018-03-18 DIAGNOSIS — Z79899 Other long term (current) drug therapy: Secondary | ICD-10-CM | POA: Insufficient documentation

## 2018-03-18 DIAGNOSIS — Z87891 Personal history of nicotine dependence: Secondary | ICD-10-CM | POA: Diagnosis not present

## 2018-03-18 DIAGNOSIS — J45909 Unspecified asthma, uncomplicated: Secondary | ICD-10-CM | POA: Diagnosis not present

## 2018-03-18 DIAGNOSIS — R51 Headache: Secondary | ICD-10-CM | POA: Insufficient documentation

## 2018-03-18 DIAGNOSIS — R519 Headache, unspecified: Secondary | ICD-10-CM

## 2018-03-18 LAB — HCG, QUANTITATIVE, PREGNANCY

## 2018-03-18 MED ORDER — ACETAMINOPHEN 500 MG PO TABS
1000.0000 mg | ORAL_TABLET | Freq: Once | ORAL | Status: AC
  Administered 2018-03-18: 1000 mg via ORAL
  Filled 2018-03-18: qty 2

## 2018-03-18 MED ORDER — MAGNESIUM SULFATE 2 GM/50ML IV SOLN
2.0000 g | Freq: Once | INTRAVENOUS | Status: AC
  Administered 2018-03-18: 2 g via INTRAVENOUS
  Filled 2018-03-18: qty 50

## 2018-03-18 MED ORDER — PROCHLORPERAZINE MALEATE 10 MG PO TABS: 10.0000 mg | ORAL_TABLET | Freq: Four times a day (QID) | ORAL | 0 refills | Status: DC | PRN

## 2018-03-18 MED ORDER — DEXAMETHASONE SODIUM PHOSPHATE 10 MG/ML IJ SOLN
10.0000 mg | Freq: Once | INTRAMUSCULAR | Status: AC
Start: 2018-03-18 — End: 2018-03-18
  Administered 2018-03-18: 10 mg via INTRAVENOUS
  Filled 2018-03-18: qty 1

## 2018-03-18 MED ORDER — DIPHENHYDRAMINE HCL 50 MG/ML IJ SOLN
25.0000 mg | Freq: Once | INTRAMUSCULAR | Status: AC
  Administered 2018-03-18: 25 mg via INTRAVENOUS
  Filled 2018-03-18: qty 1

## 2018-03-18 MED ORDER — PROCHLORPERAZINE EDISYLATE 10 MG/2ML IJ SOLN
10.0000 mg | Freq: Once | INTRAMUSCULAR | Status: AC
  Administered 2018-03-18: 10 mg via INTRAVENOUS
  Filled 2018-03-18: qty 2

## 2018-03-18 MED ORDER — SODIUM CHLORIDE 0.9 % IV BOLUS
1000.0000 mL | Freq: Once | INTRAVENOUS | Status: AC
  Administered 2018-03-18: 1000 mL via INTRAVENOUS

## 2018-03-18 MED ORDER — KETOROLAC TROMETHAMINE 30 MG/ML IJ SOLN
15.0000 mg | Freq: Once | INTRAMUSCULAR | Status: AC
  Administered 2018-03-18: 15 mg via INTRAVENOUS
  Filled 2018-03-18: qty 1

## 2018-03-18 NOTE — ED Notes (Signed)
  Esign not working. Pt verbalized discharge instructions and has no questions at this time. 
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
## 2018-03-18 NOTE — ED Triage Notes (Signed)
Pt here with c/o migraine pain for about 2 weeks now, intermittent vomiting as well, has taken tramadol, flexeril and imitrex without relief.

## 2018-03-18 NOTE — Discharge Instructions (Addendum)

## 2019-04-26 ENCOUNTER — Other Ambulatory Visit: Payer: Self-pay | Admitting: Acute Care

## 2019-04-26 ENCOUNTER — Other Ambulatory Visit (HOSPITAL_COMMUNITY): Payer: Self-pay | Admitting: Acute Care

## 2019-04-26 DIAGNOSIS — G932 Benign intracranial hypertension: Secondary | ICD-10-CM

## 2019-04-28 ENCOUNTER — Encounter: Payer: Self-pay | Admitting: *Deleted

## 2019-04-28 ENCOUNTER — Other Ambulatory Visit: Payer: Self-pay

## 2019-04-28 ENCOUNTER — Ambulatory Visit
Admission: RE | Admit: 2019-04-28 | Discharge: 2019-04-28 | Disposition: A | Discharge status: Home / Self Care | Payer: Medicare Other | Source: Ambulatory Visit | Attending: Acute Care | Admitting: Acute Care

## 2019-04-28 DIAGNOSIS — G932 Benign intracranial hypertension: Secondary | ICD-10-CM | POA: Insufficient documentation

## 2019-04-28 LAB — POCT I-STAT CREATININE: Creatinine, Ser: 1 mg/dL (ref 0.44–1.00)

## 2019-04-28 MED ORDER — GADOBUTROL 1 MMOL/ML IV SOLN
9.0000 mL | Freq: Once | INTRAVENOUS | Status: AC | PRN
  Administered 2019-04-28: 16:00:00 7.5 mL via INTRAVENOUS

## 2019-05-04 ENCOUNTER — Inpatient Hospital Stay: Admission: RE | Admit: 2019-05-04 | Payer: Medicare Other | Source: Ambulatory Visit

## 2019-07-09 ENCOUNTER — Other Ambulatory Visit: Payer: Self-pay

## 2019-07-09 ENCOUNTER — Emergency Department: Payer: Medicare Other

## 2019-07-09 ENCOUNTER — Emergency Department
Admission: EM | Admit: 2019-07-09 | Discharge: 2019-07-09 | Disposition: A | Discharge status: Home / Self Care | Payer: Medicare Other | Attending: Student in an Organized Health Care Education/Training Program | Admitting: Student in an Organized Health Care Education/Training Program

## 2019-07-09 DIAGNOSIS — Z79899 Other long term (current) drug therapy: Secondary | ICD-10-CM | POA: Diagnosis not present

## 2019-07-09 DIAGNOSIS — M25571 Pain in right ankle and joints of right foot: Secondary | ICD-10-CM

## 2019-07-09 DIAGNOSIS — Z91040 Latex allergy status: Secondary | ICD-10-CM | POA: Diagnosis not present

## 2019-07-09 DIAGNOSIS — Z87891 Personal history of nicotine dependence: Secondary | ICD-10-CM | POA: Diagnosis not present

## 2019-07-09 MED ORDER — IBUPROFEN 600 MG PO TABS: 600.0000 mg | ORAL_TABLET | Freq: Three times a day (TID) | ORAL | 0 refills | Status: DC | PRN

## 2019-07-09 NOTE — ED Notes (Signed)
Wrapped pt's right ankle with ace bandage- pt's ambulated with some pain. Additional ace wrap applied- pt verbalizes greater comfort with ambulation. CMS intact, capillary refill <3 seconds. Pt educated on RICE therapy and care of wrap. Pt verbalizes understanding of education.

## 2019-07-09 NOTE — ED Triage Notes (Signed)
Pt c/o R ankle pain. Denies injury. A&O.
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
## 2019-07-09 NOTE — Discharge Instructions (Addendum)
You were seen today for right ankle/lower leg pain. Your xray is normal. Clinically, there is no concern for a blood clot. We have wrapped your ankle in an ACE wrap for comfort. I have given you a RX for Ibuprofen to take every 8 hours as needed with food. Follow up with your PCP early next week if symptoms persist or worsen.

## 2019-08-11 ENCOUNTER — Ambulatory Visit: Payer: Medicare Other

## 2020-06-19 ENCOUNTER — Other Ambulatory Visit: Payer: Self-pay | Admitting: Family

## 2020-06-19 DIAGNOSIS — T81718A Complication of other artery following a procedure, not elsewhere classified, initial encounter: Secondary | ICD-10-CM

## 2020-06-19 DIAGNOSIS — R200 Anesthesia of skin: Secondary | ICD-10-CM

## 2020-06-19 DIAGNOSIS — I798 Other disorders of arteries, arterioles and capillaries in diseases classified elsewhere: Secondary | ICD-10-CM

## 2020-06-19 DIAGNOSIS — G64 Other disorders of peripheral nervous system: Secondary | ICD-10-CM

## 2020-06-19 DIAGNOSIS — I771 Stricture of artery: Secondary | ICD-10-CM

## 2020-06-19 DIAGNOSIS — G540 Brachial plexus disorders: Secondary | ICD-10-CM

## 2020-06-19 DIAGNOSIS — R202 Paresthesia of skin: Secondary | ICD-10-CM

## 2020-08-14 ENCOUNTER — Ambulatory Visit (INDEPENDENT_AMBULATORY_CARE_PROVIDER_SITE_OTHER): Payer: Medicare Other | Admitting: General Surgery

## 2020-08-14 ENCOUNTER — Encounter: Payer: Self-pay | Admitting: General Surgery

## 2020-08-14 ENCOUNTER — Other Ambulatory Visit: Payer: Self-pay

## 2020-08-14 VITALS — BP 133/72 | HR 89 | Temp 98.0°F | Ht 66.0 in | Wt 207.0 lb

## 2020-08-14 DIAGNOSIS — K429 Umbilical hernia without obstruction or gangrene: Secondary | ICD-10-CM

## 2020-08-14 NOTE — Progress Notes (Signed)
Request for Cardiac Clearance has been faxed to Dr Macarthur Critchley Khan's office.
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
## 2020-08-14 NOTE — Patient Instructions (Addendum)
You have requested for your Umbilical Hernia be repaired. This will be scheduled at [facility] by Dr. Lady Gary.   Please see your (blue)pre-care sheet for information. Our surgery scheduler will call you to look at surgery dates and to go over information.   You will need to arrange to be off work for 1-2 weeks but will have to have a lifting restriction of no more than 15 lbs for 6 weeks following your surgery.    Umbilical Hernia, Adult A hernia is a bulge of tissue that pushes through an opening between muscles. An umbilical hernia happens in the abdomen, near the belly button (umbilicus). The hernia may contain tissues from the small intestine, large intestine, or fatty tissue covering the intestines (omentum). Umbilical hernias in adults tend to get worse over time, and they require surgical treatment. There are several types of umbilical hernias. You may have:  A hernia located just above or below the umbilicus (indirect hernia). This is the most common type of umbilical hernia in adults.  A hernia that forms through an opening formed by the umbilicus (direct hernia).  A hernia that comes and goes (reducible hernia). A reducible hernia may be visible only when you strain, lift something heavy, or cough. This type of hernia can be pushed back into the abdomen (reduced).  A hernia that traps abdominal tissue inside the hernia (incarcerated hernia). This type of hernia cannot be reduced.  A hernia that cuts off blood flow to the tissues inside the hernia (strangulated hernia). The tissues can start to die if this happens. This type of hernia requires emergency treatment.  What are the causes? An umbilical hernia happens when tissue inside the abdomen presses on a weak area of the abdominal muscles. What increases the risk? You may have a greater risk of this condition if you:  Are obese.  Have had several pregnancies.  Have a buildup of fluid inside your abdomen (ascites).  Have had  surgery that weakens the abdominal muscles.  What are the signs or symptoms? The main symptom of this condition is a painless bulge at or near the belly button. A reducible hernia may be visible only when you strain, lift something heavy, or cough. Other symptoms may include:  Dull pain.  A feeling of pressure.  Symptoms of a strangulated hernia may include:  Pain that gets increasingly worse.  Nausea and vomiting.  Pain when pressing on the hernia.  Skin over the hernia becoming red or purple.  Constipation.  Blood in the stool.  How is this diagnosed? This condition may be diagnosed based on:  A physical exam. You may be asked to cough or strain while standing. These actions increase the pressure inside your abdomen and force the hernia through the opening in your muscles. Your health care provider may try to reduce the hernia by pressing on it.  Your symptoms and medical history.  How is this treated? Surgery is the only treatment for an umbilical hernia. Surgery for a strangulated hernia is done as soon as possible. If you have a small hernia that is not incarcerated, you may need to lose weight before having surgery. Follow these instructions at home:  Lose weight, if told by your health care provider.  Do not try to push the hernia back in.  Watch your hernia for any changes in color or size. Tell your health care provider if any changes occur.  You may need to avoid activities that increase pressure on your hernia.  Do not lift anything that is heavier than 10 lb (4.5 kg) until your health care provider says that this is safe.  Take over-the-counter and prescription medicines only as told by your health care provider.  Keep all follow-up visits as told by your health care provider. This is important. Contact a health care provider if:  Your hernia gets larger.  Your hernia becomes painful. Get help right away if:  You develop sudden, severe pain near the  area of your hernia.  You have pain as well as nausea or vomiting.  You have pain and the skin over your hernia changes color.  You develop a fever. This information is not intended to replace advice given to you by your health care provider. Make sure you discuss any questions you have with your health care provider. Document Released: 10/05/2015 Document Revised: 01/06/2016 Document Reviewed: 10/05/2015 Elsevier Interactive Patient Education  [employer].
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
## 2020-08-15 ENCOUNTER — Telehealth: Payer: Self-pay | Admitting: General Surgery

## 2020-08-15 NOTE — Telephone Encounter (Signed)
It doesn't look like I have anything earlier to offer her. I'm actually not totally convinced that this tiny little hernia is really responsible for the degree of pain she is endorsing.  I would have her alternate taking ibuprofen 600 mg every 6 hours and tylenol 650 mg every 6 hours, so that she is taking something every 3 hours around the clock. Thanks! --Kem Boroughs

## 2020-08-15 NOTE — Telephone Encounter (Signed)
I called the patient to get her surgery scheduled.  She is scheduled for May 2nd, 2022.  This was the first I could get her in.  Patient wants to know in the meantime if there is anything that she can do to help with the pain.  Please call her, thank you.

## 2020-08-15 NOTE — Telephone Encounter (Signed)
Umbilical hernia repair 09/17/20- Patient is requesting a sooner surgery date than 09/17/20-she states she is having pain and discomfort and she has four children to care for at home and the pain /discomfort from hernia is keeping her from doing things she would normally be able to do.

## 2020-08-15 NOTE — Telephone Encounter (Signed)
Patient has been rescheduled for surgery to 08/24/20.  The OR was able to wiggle some room and work her in.  Patient is aware of this, will reach out once all has been arranged.

## 2020-08-16 ENCOUNTER — Telehealth: Payer: Self-pay | Admitting: General Surgery

## 2020-08-16 NOTE — Telephone Encounter (Signed)
Patient has been advised of Pre-Admission date/time, COVID Testing date and Surgery date.  Surgery Date: 08/24/20 Preadmission Testing Date: 08/17/20 (phone 8a-1p) Covid Testing Date: 08/22/20 in person @ 8:15 am - patient [facility] to [facility] to [facility] ([address] [facility])   Patient has been made aware to call [phone], between 1-3:00pm the day before surgery, to find out what time to arrive for surgery.

## 2020-08-17 ENCOUNTER — Encounter
Admission: RE | Admit: 2020-08-17 | Discharge: 2020-08-17 | Disposition: A | Discharge status: Home / Self Care | Payer: Medicare Other | Source: Ambulatory Visit | Attending: General Surgery | Admitting: General Surgery

## 2020-08-17 ENCOUNTER — Other Ambulatory Visit: Payer: Self-pay

## 2020-08-17 ENCOUNTER — Telehealth: Payer: Self-pay | Admitting: *Deleted

## 2020-08-17 DIAGNOSIS — Z01818 Encounter for other preprocedural examination: Secondary | ICD-10-CM | POA: Insufficient documentation

## 2020-08-17 HISTORY — DX: Anemia, unspecified: D64.9

## 2020-08-17 HISTORY — DX: Fibromyalgia: M79.7

## 2020-08-17 HISTORY — DX: Headache, unspecified: R51.9

## 2020-08-17 HISTORY — DX: Angina pectoris, unspecified: I20.9

## 2020-08-17 HISTORY — DX: Gastro-esophageal reflux disease without esophagitis: K21.9

## 2020-08-17 HISTORY — DX: Unspecified osteoarthritis, unspecified site: M19.90

## 2020-08-17 NOTE — Patient Instructions (Addendum)
Your procedure is scheduled on:08-24-20 FRIDAY Report to the Registration Desk on the 1st floor of the [facility]-Then proceed to the 2nd floor Surgery Desk in the [facility] To find out your arrival time, please call [phone] between 1PM - 3PM on:08-23-20 THURSDAY  REMEMBER: Instructions that are not followed completely may result in serious medical risk, up to and including death; or upon the discretion of your surgeon and anesthesiologist your surgery may need to be rescheduled.  Do not eat food after midnight the night before surgery.  No gum chewing, lozengers or hard candies.  You may however, drink CLEAR liquids up to 2 hours before you are scheduled to arrive for your surgery. Do not drink anything within 2 hours of your scheduled arrival time.  Clear liquids include: - water  - apple juice without pulp - gatorade (not RED, PURPLE, OR BLUE) - black coffee or tea (Do NOT add milk or creamers to the coffee or tea) Do NOT drink anything that is not on this list.  TAKE THESE MEDICATIONS THE MORNING OF SURGERY WITH A SIP OF WATER: -HYDRALAZINE (APRESOLINE) -METOPROLOL (TOPROL) -LYRICA (PREGABALIN) -PROTONIX (PANTOPRAZOLE)-take one the night before and one on the morning of surgery - helps to prevent nausea after surgery.)  One week prior to surgery: Stop Anti-inflammatories (NSAIDS) such as DICLOFENAC (VOLTAREN), Advil, Aleve, Ibuprofen, Motrin, Naproxen, Naprosyn and Aspirin based products such as Excedrin, Goodys Powder, BC Powder-OK TO TAKE TYLENOL/TRAMADOL IF NEEDED  Stop ANY OVER THE COUNTER supplements until after surgery-OK TO CONTINUE YOUR VITAMIN D   No Alcohol for 24 hours before or after surgery.  No Smoking including e-cigarettes for 24 hours prior to surgery.  No chewable tobacco products for at least 6 hours prior to surgery.  No nicotine patches on the day of surgery.  Do not use any "recreational" drugs for at least a week prior to your surgery.  Please  be advised that the combination of cocaine and anesthesia may have negative outcomes, up to and including death. If you test positive for cocaine, your surgery will be cancelled.  On the morning of surgery brush your teeth with toothpaste and water, you may rinse your mouth with mouthwash if you wish. Do not swallow any toothpaste or mouthwash.  Do not wear jewelry, make-up, hairpins, clips or nail polish.  Do not wear lotions, powders, or perfumes.   Do not shave body from the neck down 48 hours prior to surgery just in case you cut yourself which could leave a site for infection.  Also, freshly shaved skin may become irritated if using the CHG soap.  Contact lenses, hearing aids and dentures may not be worn into surgery.  Do not bring valuables to the hospital. [facility] is not responsible for any missing/lost belongings or valuables.   Use CHG Soap as directed on instruction sheet.  Notify your doctor if there is any change in your medical condition (cold, fever, infection).  Wear comfortable clothing (specific to your surgery type) to the hospital.  Plan for stool softeners for home use; pain medications have a tendency to cause constipation. You can also help prevent constipation by eating foods high in fiber such as fruits and vegetables and drinking plenty of fluids as your diet allows.  After surgery, you can help prevent lung complications by doing breathing exercises.  Take deep breaths and cough every 1-2 hours. Your doctor may order a device called an Incentive Spirometer to help you take deep breaths. When coughing  or sneezing, hold a pillow firmly against your incision with both hands. This is called "splinting." Doing this helps protect your incision. It also decreases belly discomfort.  If you are being admitted to the hospital overnight, leave your suitcase in the car. After surgery it may be brought to your room.  If you are being discharged the day of surgery,  you will not be allowed to drive home. You will need a responsible adult (18 years or older) to drive you home and stay with you that night.   If you are taking public transportation, you will need to have a responsible adult (18 years or older) with you. Please confirm with your physician that it is acceptable to use public transportation.   Please call the Pre-admissions Testing Dept. at [phone] if you have any questions about these instructions.  Surgery Visitation Policy:  Patients undergoing a surgery or procedure may have one family member or support person with them as long as that person is not COVID-19 positive or experiencing its symptoms.  That person may remain in the waiting area during the procedure.  Inpatient Visitation:    Visiting hours are 7 a.m. to 8 p.m. Inpatients will be allowed two visitors daily. The visitors may change each day during the patient's stay. No visitors under the age of 55. Any visitor under the age of 32 must be accompanied by an adult. The visitor must pass COVID-19 screenings, use hand sanitizer when entering and exiting the patient's room and wear a mask at all times, including in the patient's room. Patients must also wear a mask when staff or their visitor are in the room. Masking is required regardless of vaccination status.

## 2020-08-17 NOTE — Telephone Encounter (Signed)
[name] from [employer] called and stated that Robin Myers is having her Echo done today and will have the results by 08/20/20. If you want to fax another clearance request you can.

## 2020-08-22 ENCOUNTER — Other Ambulatory Visit: Admission: RE | Admit: 2020-08-22 | Payer: Medicare Other | Source: Ambulatory Visit

## 2020-08-24 ENCOUNTER — Encounter: Payer: Self-pay | Admitting: General Surgery

## 2020-08-24 ENCOUNTER — Ambulatory Visit: Payer: Medicare Other | Admitting: Anesthesiology

## 2020-08-24 ENCOUNTER — Encounter
Admission: RE | Disposition: A | Discharge status: Home / Self Care | Payer: Self-pay | Source: Home / Self Care | Attending: General Surgery

## 2020-08-24 ENCOUNTER — Ambulatory Visit
Admission: RE | Admit: 2020-08-24 | Discharge: 2020-08-24 | Disposition: A | Discharge status: Home / Self Care | Payer: Medicare Other | Attending: General Surgery | Admitting: General Surgery

## 2020-08-24 DIAGNOSIS — Z79899 Other long term (current) drug therapy: Secondary | ICD-10-CM | POA: Insufficient documentation

## 2020-08-24 DIAGNOSIS — K429 Umbilical hernia without obstruction or gangrene: Secondary | ICD-10-CM | POA: Diagnosis not present

## 2020-08-24 DIAGNOSIS — Z87891 Personal history of nicotine dependence: Secondary | ICD-10-CM | POA: Insufficient documentation

## 2020-08-24 DIAGNOSIS — Z91040 Latex allergy status: Secondary | ICD-10-CM | POA: Insufficient documentation

## 2020-08-24 DIAGNOSIS — Z91018 Allergy to other foods: Secondary | ICD-10-CM | POA: Insufficient documentation

## 2020-08-24 DIAGNOSIS — Z793 Long term (current) use of hormonal contraceptives: Secondary | ICD-10-CM | POA: Insufficient documentation

## 2020-08-24 DIAGNOSIS — Z9889 Other specified postprocedural states: Secondary | ICD-10-CM | POA: Diagnosis not present

## 2020-08-24 HISTORY — PX: UMBILICAL HERNIA REPAIR: SHX196

## 2020-08-24 LAB — POCT PREGNANCY, URINE: Preg Test, Ur: NEGATIVE

## 2020-08-24 SURGERY — REPAIR, HERNIA, UMBILICAL, ADULT
Anesthesia: General | Site: Abdomen

## 2020-08-24 MED ORDER — FENTANYL CITRATE (PF) 100 MCG/2ML IJ SOLN
25.0000 ug | INTRAMUSCULAR | Status: DC | PRN
  Administered 2020-08-24 (×2): 50 ug via INTRAVENOUS
  Administered 2020-08-24: 25 ug via INTRAVENOUS

## 2020-08-24 MED ORDER — MIDAZOLAM HCL 2 MG/2ML IJ SOLN
INTRAMUSCULAR | Status: AC
  Filled 2020-08-24: qty 2

## 2020-08-24 MED ORDER — FENTANYL CITRATE (PF) 100 MCG/2ML IJ SOLN
INTRAMUSCULAR | Status: AC
  Administered 2020-08-24: 25 ug via INTRAVENOUS
  Filled 2020-08-24: qty 2

## 2020-08-24 MED ORDER — CHLORHEXIDINE GLUCONATE 0.12 % MT SOLN: 15.0000 mL | Freq: Once | OROMUCOSAL | Status: AC

## 2020-08-24 MED ORDER — DEXAMETHASONE SODIUM PHOSPHATE 10 MG/ML IJ SOLN
INTRAMUSCULAR | Status: DC | PRN
  Administered 2020-08-24: 10 mg via INTRAVENOUS

## 2020-08-24 MED ORDER — BUPIVACAINE LIPOSOME 1.3 % IJ SUSP: 20.0000 mL | Freq: Once | INTRAMUSCULAR | Status: DC

## 2020-08-24 MED ORDER — LACTATED RINGERS IV SOLN: INTRAVENOUS | Status: DC

## 2020-08-24 MED ORDER — LABETALOL HCL 5 MG/ML IV SOLN: 5.0000 mg | Freq: Once | INTRAVENOUS | Status: DC

## 2020-08-24 MED ORDER — PROPOFOL 10 MG/ML IV BOLUS
INTRAVENOUS | Status: DC | PRN
  Administered 2020-08-24: 200 mg via INTRAVENOUS

## 2020-08-24 MED ORDER — OXYCODONE HCL 5 MG/5ML PO SOLN: 5.0000 mg | Freq: Once | ORAL | Status: AC | PRN

## 2020-08-24 MED ORDER — ONDANSETRON HCL 4 MG/2ML IJ SOLN
INTRAMUSCULAR | Status: DC | PRN
  Administered 2020-08-24: 4 mg via INTRAVENOUS

## 2020-08-24 MED ORDER — OXYCODONE HCL 5 MG PO TABS
5.0000 mg | ORAL_TABLET | Freq: Once | ORAL | Status: AC
  Administered 2020-08-24: 5 mg via ORAL

## 2020-08-24 MED ORDER — FENTANYL CITRATE (PF) 100 MCG/2ML IJ SOLN
INTRAMUSCULAR | Status: DC | PRN
  Administered 2020-08-24: 100 ug via INTRAVENOUS

## 2020-08-24 MED ORDER — OXYCODONE HCL 5 MG PO TABS
ORAL_TABLET | ORAL | Status: AC
  Filled 2020-08-24: qty 1

## 2020-08-24 MED ORDER — LIDOCAINE-EPINEPHRINE 1 %-1:100000 IJ SOLN
INTRAMUSCULAR | Status: AC
  Filled 2020-08-24: qty 1

## 2020-08-24 MED ORDER — ORAL CARE MOUTH RINSE: 15.0000 mL | Freq: Once | OROMUCOSAL | Status: AC

## 2020-08-24 MED ORDER — OXYCODONE HCL 5 MG PO TABS
5.0000 mg | ORAL_TABLET | Freq: Once | ORAL | Status: AC | PRN
Start: 2020-08-24 — End: 2020-08-24
  Administered 2020-08-24: 5 mg via ORAL

## 2020-08-24 MED ORDER — PROPOFOL 10 MG/ML IV BOLUS
INTRAVENOUS | Status: AC
  Filled 2020-08-24: qty 20

## 2020-08-24 MED ORDER — LIDOCAINE HCL (CARDIAC) PF 100 MG/5ML IV SOSY
PREFILLED_SYRINGE | INTRAVENOUS | Status: DC | PRN
  Administered 2020-08-24: 100 mg via INTRAVENOUS

## 2020-08-24 MED ORDER — FENTANYL CITRATE (PF) 100 MCG/2ML IJ SOLN
INTRAMUSCULAR | Status: AC
  Filled 2020-08-24: qty 2

## 2020-08-24 MED ORDER — CELECOXIB 200 MG PO CAPS: 200.0000 mg | ORAL_CAPSULE | ORAL | Status: AC

## 2020-08-24 MED ORDER — SUGAMMADEX SODIUM 200 MG/2ML IV SOLN
INTRAVENOUS | Status: DC | PRN
  Administered 2020-08-24: 200 mg via INTRAVENOUS

## 2020-08-24 MED ORDER — OXYCODONE HCL 5 MG PO TABS: 5.0000 mg | ORAL_TABLET | Freq: Four times a day (QID) | ORAL | 0 refills | Status: DC | PRN

## 2020-08-24 MED ORDER — ACETAMINOPHEN 500 MG PO TABS: 1000.0000 mg | ORAL_TABLET | Freq: Four times a day (QID) | ORAL | 0 refills | Status: AC

## 2020-08-24 MED ORDER — ACETAMINOPHEN 500 MG PO TABS: 1000.0000 mg | ORAL_TABLET | ORAL | Status: AC

## 2020-08-24 MED ORDER — HYDRALAZINE HCL 20 MG/ML IJ SOLN: 10.0000 mg | Freq: Once | INTRAMUSCULAR | Status: AC

## 2020-08-24 MED ORDER — LABETALOL HCL 5 MG/ML IV SOLN
INTRAVENOUS | Status: AC
  Filled 2020-08-24: qty 4

## 2020-08-24 MED ORDER — CHLORHEXIDINE GLUCONATE CLOTH 2 % EX PADS: 6.0000 | MEDICATED_PAD | Freq: Once | CUTANEOUS | Status: DC

## 2020-08-24 MED ORDER — ONDANSETRON HCL 4 MG/2ML IJ SOLN
INTRAMUSCULAR | Status: AC
  Filled 2020-08-24: qty 2

## 2020-08-24 MED ORDER — DEXAMETHASONE SODIUM PHOSPHATE 10 MG/ML IJ SOLN
INTRAMUSCULAR | Status: AC
  Filled 2020-08-24: qty 1

## 2020-08-24 MED ORDER — ACETAMINOPHEN 500 MG PO TABS
ORAL_TABLET | ORAL | Status: AC
  Administered 2020-08-24: 1000 mg via ORAL
  Filled 2020-08-24: qty 2

## 2020-08-24 MED ORDER — ROCURONIUM BROMIDE 100 MG/10ML IV SOLN
INTRAVENOUS | Status: DC | PRN
  Administered 2020-08-24: 50 mg via INTRAVENOUS

## 2020-08-24 MED ORDER — CEFAZOLIN SODIUM-DEXTROSE 2-4 GM/100ML-% IV SOLN
2.0000 g | INTRAVENOUS | Status: AC
  Administered 2020-08-24: 2 g via INTRAVENOUS

## 2020-08-24 MED ORDER — MIDAZOLAM HCL 2 MG/2ML IJ SOLN
INTRAMUSCULAR | Status: DC | PRN
  Administered 2020-08-24: 2 mg via INTRAVENOUS

## 2020-08-24 MED ORDER — CHLORHEXIDINE GLUCONATE 0.12 % MT SOLN
OROMUCOSAL | Status: AC
  Administered 2020-08-24: 15 mL via OROMUCOSAL
  Filled 2020-08-24: qty 15

## 2020-08-24 MED ORDER — LIDOCAINE-EPINEPHRINE 1 %-1:100000 IJ SOLN
INTRAMUSCULAR | Status: DC | PRN
  Administered 2020-08-24: 10 mL via INTRAMUSCULAR

## 2020-08-24 MED ORDER — CELECOXIB 200 MG PO CAPS
ORAL_CAPSULE | ORAL | Status: AC
  Administered 2020-08-24: 200 mg via ORAL
  Filled 2020-08-24: qty 1

## 2020-08-24 MED ORDER — HYDRALAZINE HCL 20 MG/ML IJ SOLN
INTRAMUSCULAR | Status: AC
  Administered 2020-08-24: 10 mg via INTRAVENOUS
  Filled 2020-08-24: qty 1

## 2020-08-24 MED ORDER — ONDANSETRON HCL 4 MG/2ML IJ SOLN: 4.0000 mg | Freq: Once | INTRAMUSCULAR | Status: DC | PRN

## 2020-08-24 MED ORDER — BUPIVACAINE LIPOSOME 1.3 % IJ SUSP
INTRAMUSCULAR | Status: DC | PRN
  Administered 2020-08-24: 20 mL

## 2020-08-24 MED ORDER — CEFAZOLIN SODIUM-DEXTROSE 2-4 GM/100ML-% IV SOLN
INTRAVENOUS | Status: AC
  Filled 2020-08-24: qty 100

## 2020-08-24 SURGICAL SUPPLY — 38 items
ADH SKN CLS APL DERMABOND .7 (GAUZE/BANDAGES/DRESSINGS) ×1
APL PRP STRL LF DISP 70% ISPRP (MISCELLANEOUS) ×1
BLADE SURG 15 STRL LF DISP TIS (BLADE) ×1 IMPLANT
BLADE SURG 15 STRL SS (BLADE) ×2
CANISTER SUCT 1200ML W/VALVE (MISCELLANEOUS) IMPLANT
CHLORAPREP W/TINT 26 (MISCELLANEOUS) ×2 IMPLANT
COVER WAND RF STERILE (DRAPES) ×2 IMPLANT
DERMABOND ADVANCED (GAUZE/BANDAGES/DRESSINGS) ×1
DERMABOND ADVANCED .7 DNX12 (GAUZE/BANDAGES/DRESSINGS) ×1 IMPLANT
DRAPE LAPAROTOMY 77X122 PED (DRAPES) ×2 IMPLANT
ELECT CAUTERY BLADE TIP 2.5 (TIP) ×2
ELECT REM PT RETURN 9FT ADLT (ELECTROSURGICAL) ×2
ELECTRODE CAUTERY BLDE TIP 2.5 (TIP) ×1 IMPLANT
ELECTRODE REM PT RTRN 9FT ADLT (ELECTROSURGICAL) ×1 IMPLANT
GLOVE SURG ENC MOIS LTX SZ6.5 (GLOVE) ×2 IMPLANT
GLOVE SURG UNDER LTX SZ7 (GLOVE) ×4 IMPLANT
GOWN STRL REUS W/ TWL LRG LVL3 (GOWN DISPOSABLE) ×2 IMPLANT
GOWN STRL REUS W/TWL LRG LVL3 (GOWN DISPOSABLE) ×4
KIT TURNOVER KIT A (KITS) ×2 IMPLANT
LABEL OR SOLS (LABEL) ×2 IMPLANT
MANIFOLD NEPTUNE II (INSTRUMENTS) ×2 IMPLANT
MESH VENTRALEX ST 8CM LRG (Mesh General) ×1 IMPLANT
MESH VENTRALEX ST CIRCLE MED (Mesh General) ×1 IMPLANT
MESH VENTRALEX ST CIRCLE SM (Mesh General) ×1 IMPLANT
NEEDLE HYPO 22GX1.5 SAFETY (NEEDLE) ×4 IMPLANT
NS IRRIG 500ML POUR BTL (IV SOLUTION) ×2 IMPLANT
PACK BASIN MINOR ARMC (MISCELLANEOUS) ×2 IMPLANT
STRIP CLOSURE SKIN 1/2X4 (GAUZE/BANDAGES/DRESSINGS) ×2 IMPLANT
SUT ETHIBOND NAB MO 7 #0 18IN (SUTURE) ×2 IMPLANT
SUT MNCRL 4-0 (SUTURE) ×2
SUT MNCRL 4-0 27XMFL (SUTURE) ×1
SUT VIC AB 3-0 SH 27 (SUTURE) ×2
SUT VIC AB 3-0 SH 27X BRD (SUTURE) ×1 IMPLANT
SUT VICRYL 2-0 SH 8X27 (SUTURE) IMPLANT
SUTURE MNCRL 4-0 27XMF (SUTURE) ×1 IMPLANT
SYR 10ML LL (SYRINGE) ×2 IMPLANT
SYR 20ML LL LF (SYRINGE) ×2 IMPLANT
SYR BULB IRRIG 60ML STRL (SYRINGE) ×2 IMPLANT

## 2020-08-24 NOTE — Interval H&P Note (Signed)
History and Physical Interval Note:  08/24/2020 10:53 AM  Robin Myers  has presented today for surgery, with the diagnosis of umbilical hernia.  The various methods of treatment have been discussed with the patient and family. After consideration of risks, benefits and other options for treatment, the patient has consented to  Procedure(s): HERNIA REPAIR UMBILICAL ADULT, open (N/A) as a surgical intervention.  The patient's history has been reviewed, patient examined, no change in status, stable for surgery.  I have reviewed the patient's chart and labs.  Questions were answered to the patient's satisfaction.     Duanne Guess

## 2020-08-24 NOTE — Discharge Instructions (Addendum)
Open Hernia Repair, Adult, Care After The following information offers guidance on how to care for yourself after your procedure. Your health care provider may also give you more specific instructions. If you have problems or questions, contact your health care provider. What can I expect after the procedure? After the procedure, it is common to have:  Mild discomfort.  Slight bruising.  Minor swelling.  Pain in the abdomen.  A small amount of blood from the incision. Follow these instructions at home: Medicines  Take over-the-counter and prescription medicines only as told by your health care provider.  Ask your health care provider if the medicine prescribed to you: ? Requires you to avoid driving or using machinery. ? Can cause constipation. You may need to take these actions to prevent or treat constipation:  Drink enough fluid to keep your urine pale yellow.  Take over-the-counter or prescription medicines.  Eat foods that are high in fiber, such as beans, whole grains, and fresh fruits and vegetables.  Limit foods that are high in fat and processed sugars, such as fried or sweet foods.  If you were prescribed an antibiotic medicine, take it as told by your health care provider. Do not stop using the antibiotic even if you start to feel better. Incision care  Follow instructions from your health care provider about how to take care of your incision. Make sure you: ? Wash your hands with soap and water for at least 20 seconds before and after you change your bandage (dressing). If soap and water are not available, use hand sanitizer. ? Change your dressing as told by your health care provider. ? Leave stitches (sutures), skin glue, or adhesive strips in place. These skin closures may need to stay in place for 2 weeks or longer. If adhesive strip edges start to loosen and curl up, you may trim the loose edges. Do not remove adhesive strips completely unless your health care  provider tells you to do that.  Check your incision area every day for signs of infection. Check for: ? More redness, swelling, or pain. ? More fluid or blood. ? Warmth. ? Pus or a bad smell.  Wear loose, soft clothing while your incision heals.   Activity  Rest as told by your health care provider.  Do not lift anything that is heavier than 10 lb (4.5 kg), or the limit that you are told, until your health care provider says that it is safe.  Do not play contact sports until your health care provider says that this is safe.  If you were given a sedative during the procedure, it can affect you for several hours. Do not drive or operate machinery until your health care provider says that it is safe.  Return to your normal activities as told by your health care provider. Ask your health care provider what activities are safe for you.   General instructions  Do not take baths, swim, or use a hot tub until your health care provider approves. Ask your health care provider if you may take showers. You may only be allowed to take sponge baths.  Hold a pillow over your abdomen when you cough or sneeze. This helps with pain.  Do not use any products that contain nicotine or tobacco. These products include cigarettes, chewing tobacco, and vaping devices, such as e-cigarettes. If you need help quitting, ask your health care provider.  Keep all follow-up visits. This is important. Contact a health care provider if:  You  have any of these signs of infection: ? More redness, swelling, or pain around your incision. ? More fluid or blood coming from your incision. ? Warmth coming from your incision. ? Pus or a bad smell coming from your incision. ? A fever or chills.  You have blood in your stool (feces).  You have not had a bowel movement in 2-3 days.  Your pain is not controlled with medicine. Get help right away if:  You have chest pain or shortness of breath.  You feel faint or  light-headed.  You have severe pain.  You vomit and your pain is worse.  You have pain, swelling, or redness in a leg. These symptoms may represent a serious problem that is an emergency. Do not wait to see if the symptoms will go away. Get medical help right away. Call your local emergency services (911 in the [place] Do not drive yourself to the hospital. Summary  After an open hernia repair, it is common to have mild discomfort, slight bruising, and minor swelling.  Follow instructions from your health care provider about how to take care of your incision. Check every day for signs of infection.  Do not lift heavy objects or play contact sports until your health care provider says it is safe.  Return to your normal activities as told by your health care provider. Ask your health care provider what activities are safe for you. This information is not intended to replace advice given to you by your health care provider. Make sure you discuss any questions you have with your health care provider. Document Revised: 12/19/2019 Document Reviewed: 12/19/2019 Elsevier Patient Education  2021 Else[employer] Inc.   Bupivacaine Liposomal Suspension for Injection - WEAR GREEN BRACELET FOR 3 DAYS What is this medicine? BUPIVACAINE LIPOSOMAL (bue PIV a kane LIP oh som al) is an anesthetic. It causes loss of feeling in the skin or other tissues. It is used to prevent and to treat pain from some procedures. This medicine may be used for other purposes; ask your health care provider or pharmacist if you have questions. COMMON BRAND NAME(S): EXPAREL What should I tell my health care provider before I take this medicine? They need to know if you have any of these conditions:  G6PD deficiency  heart disease  kidney disease  liver disease  low blood pressure  lung or breathing disease, like asthma  an unusual or allergic reaction to bupivacaine, other medicines, foods, dyes, or  preservatives  pregnant or trying to get pregnant  breast-feeding How should I use this medicine? This medicine is injected into the affected area. It is given by a health care provider in a hospital or clinic setting. Talk to your health care provider about the use of this medicine in children. While it may be given to children as young as 6 years for selected conditions, precautions do apply. Overdosage: If you think you have taken too much of this medicine contact a poison control center or emergency room at once. NOTE: This medicine is only for you. Do not share this medicine with others. What if I miss a dose? This does not apply. What may interact with this medicine? This medicine may interact with the following medications:  acetaminophen  certain antibiotics like dapsone, nitrofurantoin, aminosalicylic acid, sulfonamides  certain medicines for seizures like phenobarbital, phenytoin, valproic acid  chloroquine  cyclophosphamide  flutamide  hydroxyurea  ifosfamide  metoclopramide  nitric oxide  nitroglycerin  nitroprusside  nitrous oxide  other  local anesthetics like lidocaine, pramoxine, tetracaine  primaquine  quinine  rasburicase  sulfasalazine This list may not describe all possible interactions. Give your health care provider a list of all the medicines, herbs, non-prescription drugs, or dietary supplements you use. Also tell them if you smoke, drink alcohol, or use illegal drugs. Some items may interact with your medicine. What should I watch for while using this medicine? Your condition will be monitored carefully while you are receiving this medicine. Be careful to avoid injury while the area is numb, and you are not aware of pain. What side effects may I notice from receiving this medicine? Side effects that you should report to your doctor or health care professional as soon as possible:  allergic reactions like skin rash, itching or hives,  swelling of the face, lips, or tongue  seizures  signs and symptoms of a dangerous change in heartbeat or heart rhythm like chest pain; dizziness; fast, irregular heartbeat; palpitations; feeling faint or lightheaded; falls; breathing problems  signs and symptoms of methemoglobinemia such as pale, gray, or blue colored skin; headache; fast heartbeat; shortness of breath; feeling faint or lightheaded, falls; tiredness Side effects that usually do not require medical attention (report to your doctor or health care professional if they continue or are bothersome):  anxious  back pain  changes in taste  changes in vision  constipation  dizziness  fever  nausea, vomiting This list may not describe all possible side effects. Call your doctor for medical advice about side effects. You may report side effects to FDA at 1-800-FDA-1088. Where should I keep my medicine? This drug is given in a hospital or clinic and will not be stored at home. NOTE: This sheet is a summary. It may not cover all possible information. If you have questions about this medicine, talk to your doctor, pharmacist, or health care provider.  2021 Elsevier/Gold Standard (2019-08-11 12:24:57)   AMBULATORY SURGERY  DISCHARGE INSTRUCTIONS   1) The drugs that you were given will stay in your system until tomorrow so for the next 24 hours you should not:  A) Drive an automobile B) Make any legal decisions C) Drink any alcoholic beverage   2) You may resume regular meals tomorrow.  Today it is better to start with liquids and gradually work up to solid foods.  You may eat anything you prefer, but it is better to start with liquids, then soup and crackers, and gradually work up to solid foods.   3) Please notify your doctor immediately if you have any unusual bleeding, trouble breathing, redness and pain at the surgery site, drainage, fever, or pain not relieved by medication.    4) Additional  Instructions:        Please contact your physician with any problems or Same Day Surgery at [phone], Monday through Friday 6 am to 4 pm, or [place] at [facility] number at [phone].
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
## 2020-08-24 NOTE — Op Note (Signed)
Umbilical Herniorrhaphy Procedure Note  Indications: Symptomatic umbilical hernia  Pre-operative Diagnosis: umbilical hernia  Post-operative Diagnosis: umbilical hernia  Surgeon: Duanne Guess   Assistants: None  Anesthesia: General endotracheal anesthesia  ASA Class: 2  Procedure Details  The patient was seen in the Holding Room. The risks, benefits, complications, treatment options, and expected outcomes were discussed with the patient. The possibilities of reaction to medication, pulmonary aspiration, perforation of viscus, bleeding, recurrent infection, the need for additional procedures, failure to diagnose a condition, and creating a complication requiring transfusion or operation were discussed with the patient. The patient concurred with the proposed plan, giving informed consent.  The site of surgery properly noted/marked. The patient was taken to Operating Room # 8, identified as Robin Myers and the procedure verified as Umbilical Herniorrhaphy. A Time Out was held and the above information confirmed.  The patient was placed supine.  After establishing general anesthesia, the abdomen was prepped and draped in standard fashion.  A one-to-one mixture of 0.25% bupivacaine and 1% lidocaine with epinephrine was injected into the skin and subcutaneous tissue surrounding the hernia.  A periumbilical incision was created.  Dissection was carried down to the hernia sac located above the fascia and was mobilized from surrounding structures.  Intact fascia was identified circumferentially around the defect.  The hernia sac was excised and the small amount of protruding fat returned to the abdominal cavity.  The defect was roughly half a centimeter.  This was closed with a figure-of-eight 0 Ethibond suture.  Liposomal bupivacaine was then injected along the fascial planes.  The wound was irrigated and hemostasis confirmed.  The umbilical stalk was tacked down to the fascia with interrupted 3-0  Vicryl.  The deep dermal layer was closed with interrupted 3-0 Vicryl.  The skin was closed with running subcuticular Monocryl.  The skin was cleaned.  Dermabond and Steri-Strips were applied.  The patient was awakened, extubated, and taken to the postanesthesia care unit in good condition.  Instrument, sponge, and needle counts were correct prior to closure and at the conclusion of the case.   Findings: Minuscule umbilical hernia containing a small amount of preperitoneal fat  Estimated Blood Loss:  Minimal         Drains: None         Total IV Fluids: See anesthesia record          Specimens: None         Implants: None         Complications:  None; patient tolerated the procedure well.         Disposition: PACU - hemodynamically stable.         Condition: stable  Attending Attestation: I was present and scrubbed for the entire procedure.

## 2020-08-24 NOTE — Anesthesia Procedure Notes (Signed)
Procedure Name: Intubation Date/Time: 08/24/2020 11:22 AM Performed by: Ginger Carne, CRNA Pre-anesthesia Checklist: Patient identified, Emergency Drugs available, Suction available, Patient being monitored and Timeout performed Patient Re-evaluated:Patient Re-evaluated prior to induction Oxygen Delivery Method: Circle system utilized Preoxygenation: Pre-oxygenation with 100% oxygen Induction Type: IV induction Ventilation: Mask ventilation without difficulty Laryngoscope Size: McGraph and 3 Grade View: Grade II Tube type: Oral Tube size: 6.5 mm Number of attempts: 1 Airway Equipment and Method: Stylet and Video-laryngoscopy Placement Confirmation: ETT inserted through vocal cords under direct vision,  positive ETCO2 and breath sounds checked- equal and bilateral Secured at: 21 cm Tube secured with: Tape Dental Injury: Teeth and Oropharynx as per pre-operative assessment  Difficulty Due To: Difficult Airway- due to limited oral opening

## 2020-08-24 NOTE — Transfer of Care (Signed)
Immediate Anesthesia Transfer of Care Note  Patient: Robin Myers  Procedure(s) Performed: HERNIA REPAIR UMBILICAL ADULT, open (N/A Abdomen)  Patient Location: PACU  Anesthesia Type:General  Level of Consciousness: awake, alert  and oriented  Airway & Oxygen Therapy: Patient Spontanous Breathing and Patient connected to face mask oxygen  Post-op Assessment: Report given to RN and Post -op Vital signs reviewed and stable  Post vital signs: Reviewed and stable  Last Vitals:  Vitals Value Taken Time  BP 169/110 08/24/20 1222  Temp    Pulse 68 08/24/20 1225  Resp 14 08/24/20 1225  SpO2 98 % 08/24/20 1225  Vitals shown include unvalidated device data.  Last Pain:  Vitals:   08/24/20 1011  PainSc: 9          Complications: No complications documented.

## 2020-08-24 NOTE — Anesthesia Postprocedure Evaluation (Signed)
Anesthesia Post Note  Patient: Robin Myers  Procedure(s) Performed: HERNIA REPAIR UMBILICAL ADULT, open (N/A Abdomen)  Patient location during evaluation: PACU Anesthesia Type: General Level of consciousness: awake and alert Pain management: pain level controlled Vital Signs Assessment: post-procedure vital signs reviewed and stable Respiratory status: spontaneous breathing, nonlabored ventilation, respiratory function stable and patient connected to nasal cannula oxygen Cardiovascular status: blood pressure returned to baseline and stable Postop Assessment: no apparent nausea or vomiting Anesthetic complications: no   No complications documented.   Last Vitals:  Vitals:   08/24/20 1230 08/24/20 1245  BP: (!) 156/112 (!) 177/91  Pulse: 65 82  Resp: 19 15  Temp: (!) 36.2 C   SpO2: 100% 97%    Last Pain:  Vitals:   08/24/20 1303  PainSc: 7                  Corinda Gubler

## 2020-09-06 ENCOUNTER — Encounter: Payer: Medicare Other | Admitting: General Surgery

## 2020-09-11 ENCOUNTER — Ambulatory Visit (INDEPENDENT_AMBULATORY_CARE_PROVIDER_SITE_OTHER): Payer: Medicare Other | Admitting: General Surgery

## 2020-09-11 ENCOUNTER — Encounter: Payer: Self-pay | Admitting: General Surgery

## 2020-09-11 ENCOUNTER — Other Ambulatory Visit: Payer: Self-pay

## 2020-09-11 VITALS — BP 146/109 | HR 94 | Temp 98.5°F | Ht 66.0 in | Wt 209.6 lb

## 2020-09-11 DIAGNOSIS — K429 Umbilical hernia without obstruction or gangrene: Secondary | ICD-10-CM

## 2020-09-11 NOTE — Progress Notes (Signed)
Robin Myers is here today for a postoperative visit.  She is a 35 year old woman who underwent an uncomplicated primary open umbilical hernia repair.  She states that she pulled her bandage off and that "meat came out" on April 23.  She has some abdominal discomfort, but states that taking laxatives relieves the pain.  Unfortunately, the use of laxatives has resulted in loose stools.  She prefers to manage this rather than the abdominal pain.  She denies any fevers or chills.  No nausea or vomiting.  Today's Vitals   09/11/20 1036  BP: (!) 146/109  Pulse: 94  Temp: 98.5 F (36.9 C)  TempSrc: Oral  SpO2: 97%  Weight: 209 lb 9.6 oz (95.1 kg)  Height: 5\' 6"  (1.676 m)   Body mass index is 33.83 kg/m. Focused examination demonstrates a well approximated infraumbilical incision.  I do not see any evidence of an opening where any tissue could have come out, but the patient points to an area of the scar and indicates that this is where it occurred.  There is still some glue in place.  Most of this was able to be removed with alcohol preps.  There is no erythema, induration, or drainage present.  She does have a little bit of hyperesthesia at the midportion of the scar.  Impression and plan: This is a 35 year old woman who had abdominal pain that was thought to be attributable to an umbilical hernia.  The hernia was quite small and I do not think her pain is adequately explained by the hernia, but I did elect to repair it.  I told her that if her pain did not resolve after she is healed from the surgery, she should consider further investigation with her primary care provider.  She was cautioned that she needs to maintain a 10 pound lifting restriction for a total of 6 weeks from the time of her operation.  She does report that she has been noncompliant with this, due to needing to care for her 30 pound child.  I asked her to try and minimize as much as possible but understood that there are some  limitations to the degree to which she can adhere to these recommendations.  For now, she does not appear to have any ongoing surgical needs and I will see her on an as-needed basis.

## 2020-09-11 NOTE — Patient Instructions (Signed)
If you have any concerns or questions, please feel free to call our office.     GENERAL POST-OPERATIVE PATIENT INSTRUCTIONS   WOUND CARE INSTRUCTIONS:  Keep a dry clean dressing on the wound if there is drainage. The initial bandage may be removed after 24 hours.  Once the wound has quit draining you may leave it open to air.  If clothing rubs against the wound or causes irritation and the wound is not draining you may cover it with a dry dressing during the daytime.  Try to keep the wound dry and avoid ointments on the wound unless directed to do so.  If the wound becomes bright red and painful or starts to drain infected material that is not clear, please contact your physician immediately.  If the wound is mildly pink and has a thick firm ridge underneath it, this is normal, and is referred to as a healing ridge.  This will resolve over the next 4-6 weeks.  BATHING: You may shower if you have been informed of this by your surgeon. However, Please do not submerge in a tub, hot tub, or pool until incisions are completely sealed or have been told by your surgeon that you may do so.  DIET:  You may eat any foods that you can tolerate.  It is a good idea to eat a high fiber diet and take in plenty of fluids to prevent constipation.  If you do become constipated you may want to take a mild laxative or take ducolax tablets on a daily basis until your bowel habits are regular.  Constipation can be very uncomfortable, along with straining, after recent surgery.  ACTIVITY:  You are encouraged to cough and deep breath or use your incentive spirometer if you were given one, every 15-30 minutes when awake.  This will help prevent respiratory complications and low grade fevers post-operatively if you had a general anesthetic.  You may want to hug a pillow when coughing and sneezing to add additional support to the surgical area, if you had abdominal or chest surgery, which will decrease pain during these times.   You are encouraged to walk and engage in light activity for the next two weeks. You should not lift more than 10 pounds, until 10/05/2020 as it could put you at increased risk for complications.  Twenty pounds is roughly equivalent to a plastic bag of groceries. At that time- Listen to your body when lifting, if you have pain when lifting, stop and then try again in a few days. Soreness after doing exercises or activities of daily living is normal as you get back in to your normal routine.  MEDICATIONS:  Try to take narcotic medications and anti-inflammatory medications, such as tylenol, ibuprofen, naprosyn, etc., with food.  This will minimize stomach upset from the medication.  Should you develop nausea and vomiting from the pain medication, or develop a rash, please discontinue the medication and contact your physician.  You should not drive, make important decisions, or operate machinery when taking narcotic pain medication.  SUNBLOCK Use sun block to incision area over the next year if this area will be exposed to sun. This helps decrease scarring and will allow you avoid a permanent darkened area over your incision.

## 2021-01-29 ENCOUNTER — Ambulatory Visit: Payer: Medicare Other | Admitting: Obstetrics & Gynecology

## 2021-02-06 ENCOUNTER — Encounter: Payer: Self-pay | Admitting: General Surgery

## 2021-10-04 ENCOUNTER — Ambulatory Visit (INDEPENDENT_AMBULATORY_CARE_PROVIDER_SITE_OTHER): Payer: Medicare Other | Admitting: Podiatry

## 2021-10-04 ENCOUNTER — Encounter: Payer: Self-pay | Admitting: Podiatry

## 2021-10-04 ENCOUNTER — Ambulatory Visit (INDEPENDENT_AMBULATORY_CARE_PROVIDER_SITE_OTHER): Payer: Medicare Other

## 2021-10-04 DIAGNOSIS — M76822 Posterior tibial tendinitis, left leg: Secondary | ICD-10-CM

## 2021-10-04 DIAGNOSIS — M778 Other enthesopathies, not elsewhere classified: Secondary | ICD-10-CM | POA: Diagnosis not present
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
## 2021-10-13 ENCOUNTER — Ambulatory Visit
Admission: RE | Admit: 2021-10-13 | Discharge: 2021-10-13 | Disposition: A | Discharge status: Home / Self Care | Payer: Medicare Other | Source: Ambulatory Visit | Attending: Podiatry | Admitting: Podiatry

## 2021-10-13 DIAGNOSIS — M76822 Posterior tibial tendinitis, left leg: Secondary | ICD-10-CM

## 2021-10-18 ENCOUNTER — Encounter: Payer: Self-pay | Admitting: Podiatry

## 2021-10-18 ENCOUNTER — Ambulatory Visit (INDEPENDENT_AMBULATORY_CARE_PROVIDER_SITE_OTHER): Payer: Medicare Other | Admitting: Podiatry

## 2021-10-18 DIAGNOSIS — M76822 Posterior tibial tendinitis, left leg: Secondary | ICD-10-CM | POA: Diagnosis not present

## 2021-10-23 ENCOUNTER — Encounter: Payer: Self-pay | Admitting: Podiatry
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
## 2021-11-28 ENCOUNTER — Telehealth: Payer: Self-pay | Admitting: Podiatry

## 2021-11-28 ENCOUNTER — Other Ambulatory Visit: Payer: Self-pay | Admitting: Podiatry

## 2021-11-28 DIAGNOSIS — M76822 Posterior tibial tendinitis, left leg: Secondary | ICD-10-CM | POA: Diagnosis not present

## 2021-11-28 MED ORDER — OXYCODONE-ACETAMINOPHEN 5-325 MG PO TABS: 1.0000 | ORAL_TABLET | ORAL | 0 refills | Status: DC | PRN

## 2021-11-28 NOTE — Telephone Encounter (Signed)
Okay to provide note. You can fax it or she can come into the office to pick it up. - Dr. Logan Bores

## 2021-11-28 NOTE — Telephone Encounter (Signed)
Pt called and states she had surgery today and is needing a note for her work. She is scheduled to come in on 7.21 so would like the note until that appt.

## 2021-11-28 NOTE — Progress Notes (Signed)
PRN postop 

## 2021-11-29 ENCOUNTER — Encounter: Payer: Self-pay | Admitting: Podiatry

## 2021-11-29 NOTE — Telephone Encounter (Signed)
Rest as needed. Strict nonweight bearing to the surgical foot using crutches or a knee scooter.   Yes, I'l recommend STD or FMLA. Thanks, Dr. Logan Bores

## 2021-11-29 NOTE — Telephone Encounter (Signed)
Notified pt that the letter has been emailed.Robin Myers

## 2021-11-29 NOTE — Telephone Encounter (Signed)
She would like the note emailed to her @ [email or web address]

## 2021-12-02 ENCOUNTER — Telehealth: Payer: Self-pay | Admitting: Podiatry

## 2021-12-02 NOTE — Telephone Encounter (Signed)
Pt called and her job is telling her she can still come in to work with the note that you told me to write. She is stating she has not been able to use the knee scooter as when she bends her knee it sends shooting pain to foot. She is taking the medication as prescribed and does not feel comfortable to drive. She stated she has also fallen a couple of times trying the knee scooter. She said you told her she should be out of work.  Please Advise. She is not wanting to do short term disability as she is not worried about losing her job and will not get paid.

## 2021-12-03 ENCOUNTER — Encounter: Payer: Self-pay | Admitting: Podiatry

## 2021-12-03 NOTE — Telephone Encounter (Signed)
Spoke to Dr Sharlot Gowda yesterday and he said it was ok to send note pt was out of work due to surgery. I put it down until her next appt on 7.21 and that it could be extended. I emailed it to patient as well. She said thank you.

## 2021-12-06 ENCOUNTER — Encounter: Payer: Self-pay | Admitting: Podiatry

## 2021-12-06 ENCOUNTER — Ambulatory Visit (INDEPENDENT_AMBULATORY_CARE_PROVIDER_SITE_OTHER): Payer: Medicare Other | Admitting: Podiatry

## 2021-12-06 ENCOUNTER — Ambulatory Visit (INDEPENDENT_AMBULATORY_CARE_PROVIDER_SITE_OTHER): Payer: Medicare Other

## 2021-12-06 DIAGNOSIS — Z9889 Other specified postprocedural states: Secondary | ICD-10-CM | POA: Diagnosis not present

## 2021-12-06 DIAGNOSIS — M778 Other enthesopathies, not elsewhere classified: Secondary | ICD-10-CM

## 2021-12-06 MED ORDER — CYCLOBENZAPRINE HCL 10 MG PO TABS: 10.0000 mg | ORAL_TABLET | Freq: Three times a day (TID) | ORAL | 0 refills | Status: DC | PRN

## 2021-12-06 MED ORDER — OXYCODONE-ACETAMINOPHEN 5-325 MG PO TABS: 1.0000 | ORAL_TABLET | Freq: Four times a day (QID) | ORAL | 0 refills | Status: DC | PRN
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
## 2021-12-13 ENCOUNTER — Encounter: Payer: Medicare Other | Admitting: Podiatry

## 2021-12-27 ENCOUNTER — Ambulatory Visit (INDEPENDENT_AMBULATORY_CARE_PROVIDER_SITE_OTHER): Payer: Medicare Other | Admitting: Podiatry

## 2021-12-27 ENCOUNTER — Encounter: Payer: Self-pay | Admitting: Podiatry

## 2021-12-27 DIAGNOSIS — M778 Other enthesopathies, not elsewhere classified: Secondary | ICD-10-CM

## 2021-12-27 DIAGNOSIS — Z9889 Other specified postprocedural states: Secondary | ICD-10-CM
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
## 2022-01-24 ENCOUNTER — Ambulatory Visit (INDEPENDENT_AMBULATORY_CARE_PROVIDER_SITE_OTHER): Payer: Medicare Other | Admitting: Podiatry

## 2022-01-24 ENCOUNTER — Ambulatory Visit (INDEPENDENT_AMBULATORY_CARE_PROVIDER_SITE_OTHER): Payer: Medicare Other

## 2022-01-24 ENCOUNTER — Encounter: Payer: Self-pay | Admitting: Podiatry

## 2022-01-24 DIAGNOSIS — Z9889 Other specified postprocedural states: Secondary | ICD-10-CM

## 2022-01-24 DIAGNOSIS — M778 Other enthesopathies, not elsewhere classified: Secondary | ICD-10-CM
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
## 2022-02-21 ENCOUNTER — Ambulatory Visit (INDEPENDENT_AMBULATORY_CARE_PROVIDER_SITE_OTHER): Payer: Medicare Other | Admitting: Podiatry

## 2022-02-21 ENCOUNTER — Encounter: Payer: Self-pay | Admitting: *Deleted

## 2022-02-21 DIAGNOSIS — Z9889 Other specified postprocedural states: Secondary | ICD-10-CM
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
## 2022-04-04 ENCOUNTER — Ambulatory Visit (INDEPENDENT_AMBULATORY_CARE_PROVIDER_SITE_OTHER): Payer: Medicare Other | Admitting: Podiatry

## 2022-04-04 ENCOUNTER — Ambulatory Visit (INDEPENDENT_AMBULATORY_CARE_PROVIDER_SITE_OTHER): Payer: Medicare Other

## 2022-04-04 ENCOUNTER — Encounter: Payer: Self-pay | Admitting: *Deleted

## 2022-04-04 DIAGNOSIS — G5792 Unspecified mononeuropathy of left lower limb: Secondary | ICD-10-CM
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
## 2022-05-16 ENCOUNTER — Encounter: Payer: Medicare Other | Admitting: Podiatry

## 2022-05-22 ENCOUNTER — Ambulatory Visit (INDEPENDENT_AMBULATORY_CARE_PROVIDER_SITE_OTHER): Payer: Medicare Other

## 2022-05-22 ENCOUNTER — Ambulatory Visit (INDEPENDENT_AMBULATORY_CARE_PROVIDER_SITE_OTHER): Payer: Medicare Other | Admitting: Podiatry

## 2022-05-22 ENCOUNTER — Encounter: Payer: Self-pay | Admitting: Podiatry

## 2022-05-22 VITALS — BP 128/70

## 2022-05-22 DIAGNOSIS — S86112A Strain of other muscle(s) and tendon(s) of posterior muscle group at lower leg level, left leg, initial encounter: Secondary | ICD-10-CM | POA: Diagnosis not present

## 2022-05-22 DIAGNOSIS — G5792 Unspecified mononeuropathy of left lower limb: Secondary | ICD-10-CM

## 2022-05-22 MED ORDER — METHYLPREDNISOLONE 4 MG PO TBPK: ORAL_TABLET | ORAL | 0 refills | Status: DC
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
## 2022-06-19 ENCOUNTER — Other Ambulatory Visit: Payer: Self-pay | Admitting: Internal Medicine

## 2022-06-19 DIAGNOSIS — M5440 Lumbago with sciatica, unspecified side: Secondary | ICD-10-CM

## 2022-06-19 MED ORDER — MELOXICAM 7.5 MG PO TABS: 7.5000 mg | ORAL_TABLET | Freq: Two times a day (BID) | ORAL | 1 refills | Status: DC

## 2022-06-24 ENCOUNTER — Ambulatory Visit: Payer: Medicare Other | Admitting: Podiatry

## 2022-06-26 ENCOUNTER — Other Ambulatory Visit: Payer: Self-pay

## 2022-06-26 DIAGNOSIS — M5440 Lumbago with sciatica, unspecified side: Secondary | ICD-10-CM

## 2022-06-26 MED ORDER — MELOXICAM 7.5 MG PO TABS: 7.5000 mg | ORAL_TABLET | Freq: Two times a day (BID) | ORAL | 1 refills | Status: DC

## 2022-07-15 ENCOUNTER — Ambulatory Visit: Payer: Medicare Other | Admitting: Internal Medicine

## 2022-08-04 ENCOUNTER — Other Ambulatory Visit: Payer: Self-pay | Admitting: Cardiovascular Disease

## 2022-08-04 DIAGNOSIS — R0602 Shortness of breath: Secondary | ICD-10-CM

## 2022-08-05 ENCOUNTER — Other Ambulatory Visit: Payer: Self-pay | Admitting: Internal Medicine

## 2023-03-26 ENCOUNTER — Encounter: Payer: Self-pay | Admitting: Podiatry

## 2023-03-26 ENCOUNTER — Telehealth: Payer: Self-pay | Admitting: Podiatry

## 2023-03-26 NOTE — Telephone Encounter (Signed)
Patient called stating she needed a note when she was under your care and could return to regular duty at work with no restrictions. Wrote letter for pt for her new job.

## 2023-07-03 ENCOUNTER — Ambulatory Visit: Payer: Medicare Other | Admitting: Cardiovascular Disease

## 2023-07-06 ENCOUNTER — Ambulatory Visit (INDEPENDENT_AMBULATORY_CARE_PROVIDER_SITE_OTHER): Payer: Medicare Other | Admitting: Cardiology

## 2023-07-06 ENCOUNTER — Encounter: Payer: Self-pay | Admitting: Cardiology

## 2023-07-06 VITALS — BP 116/78 | HR 90 | Ht 66.0 in | Wt 210.0 lb

## 2023-07-06 DIAGNOSIS — R0602 Shortness of breath: Secondary | ICD-10-CM | POA: Diagnosis not present

## 2023-07-06 DIAGNOSIS — I10 Essential (primary) hypertension: Secondary | ICD-10-CM | POA: Diagnosis not present

## 2023-07-06 DIAGNOSIS — M5440 Lumbago with sciatica, unspecified side: Secondary | ICD-10-CM

## 2023-07-06 MED ORDER — METOPROLOL TARTRATE 100 MG PO TABS: 100.0000 mg | ORAL_TABLET | Freq: Two times a day (BID) | ORAL | 3 refills | Status: DC

## 2023-07-06 MED ORDER — HYDRALAZINE HCL 50 MG PO TABS: 50.0000 mg | ORAL_TABLET | Freq: Every day | ORAL | 1 refills | Status: DC

## 2023-07-06 MED ORDER — BACLOFEN 10 MG PO TABS: 10.0000 mg | ORAL_TABLET | Freq: Every day | ORAL | 1 refills | Status: DC | PRN

## 2023-07-06 MED ORDER — GABAPENTIN 300 MG PO CAPS: 300.0000 mg | ORAL_CAPSULE | Freq: Every day | ORAL | 1 refills | Status: DC

## 2023-07-06 MED ORDER — MELOXICAM 7.5 MG PO TABS: 7.5000 mg | ORAL_TABLET | Freq: Two times a day (BID) | ORAL | 1 refills | Status: AC

## 2023-07-06 MED ORDER — SPIRONOLACTONE 25 MG PO TABS: 25.0000 mg | ORAL_TABLET | Freq: Every day | ORAL | 1 refills | Status: DC

## 2023-07-06 MED ORDER — AMLODIPINE BESYLATE 10 MG PO TABS: 10.0000 mg | ORAL_TABLET | Freq: Every evening | ORAL | 1 refills | Status: DC
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
## 2023-10-28 ENCOUNTER — Emergency Department (HOSPITAL_BASED_OUTPATIENT_CLINIC_OR_DEPARTMENT_OTHER)
Admission: EM | Admit: 2023-10-28 | Discharge: 2023-10-28 | Disposition: A | Discharge status: Home / Self Care | Attending: Emergency Medicine | Admitting: Emergency Medicine

## 2023-10-28 ENCOUNTER — Other Ambulatory Visit: Payer: Self-pay

## 2023-10-28 ENCOUNTER — Emergency Department (HOSPITAL_BASED_OUTPATIENT_CLINIC_OR_DEPARTMENT_OTHER)

## 2023-10-28 ENCOUNTER — Encounter (HOSPITAL_BASED_OUTPATIENT_CLINIC_OR_DEPARTMENT_OTHER): Payer: Self-pay

## 2023-10-28 DIAGNOSIS — Z91040 Latex allergy status: Secondary | ICD-10-CM | POA: Diagnosis not present

## 2023-10-28 DIAGNOSIS — R202 Paresthesia of skin: Secondary | ICD-10-CM | POA: Insufficient documentation

## 2023-10-28 DIAGNOSIS — M79602 Pain in left arm: Secondary | ICD-10-CM | POA: Diagnosis not present

## 2023-10-28 DIAGNOSIS — I10 Essential (primary) hypertension: Secondary | ICD-10-CM | POA: Insufficient documentation

## 2023-10-28 DIAGNOSIS — Z79899 Other long term (current) drug therapy: Secondary | ICD-10-CM | POA: Insufficient documentation

## 2023-10-28 DIAGNOSIS — R200 Anesthesia of skin: Secondary | ICD-10-CM

## 2023-10-28 DIAGNOSIS — M542 Cervicalgia: Secondary | ICD-10-CM | POA: Diagnosis present

## 2023-10-28 LAB — CBC WITH DIFFERENTIAL/PLATELET
Abs Immature Granulocytes: 0.02 10*3/uL (ref 0.00–0.07)
Basophils Absolute: 0 10*3/uL (ref 0.0–0.1)
Basophils Relative: 1 %
Eosinophils Absolute: 0.1 10*3/uL (ref 0.0–0.5)
Eosinophils Relative: 1 %
HCT: 37.1 % (ref 36.0–46.0)
Hemoglobin: 12.1 g/dL (ref 12.0–15.0)
Immature Granulocytes: 0 %
Lymphocytes Relative: 29 %
Lymphs Abs: 2 10*3/uL (ref 0.7–4.0)
MCH: 26.9 pg (ref 26.0–34.0)
MCHC: 32.6 g/dL (ref 30.0–36.0)
MCV: 82.4 fL (ref 80.0–100.0)
Monocytes Absolute: 0.3 10*3/uL (ref 0.1–1.0)
Monocytes Relative: 4 %
Neutro Abs: 4.4 10*3/uL (ref 1.7–7.7)
Neutrophils Relative %: 65 %
Platelets: 337 10*3/uL (ref 150–400)
RBC: 4.5 MIL/uL (ref 3.87–5.11)
RDW: 13.7 % (ref 11.5–15.5)
WBC: 6.8 10*3/uL (ref 4.0–10.5)
nRBC: 0 % (ref 0.0–0.2)

## 2023-10-28 LAB — BASIC METABOLIC PANEL WITH GFR
Anion gap: 10 (ref 5–15)
BUN: 14 mg/dL (ref 6–20)
CO2: 24 mmol/L (ref 22–32)
Calcium: 9 mg/dL (ref 8.9–10.3)
Chloride: 105 mmol/L (ref 98–111)
Creatinine, Ser: 1.01 mg/dL — ABNORMAL HIGH (ref 0.44–1.00)
GFR, Estimated: >60 mL/min (ref >60)
Glucose, Bld: 96 mg/dL (ref 70–99)
Potassium: 4.1 mmol/L (ref 3.5–5.1)
Sodium: 138 mmol/L (ref 135–145)

## 2023-10-28 LAB — HCG, QUANTITATIVE, PREGNANCY: hCG, Beta Chain, Quant, S: <1 m[IU]/mL (ref <5)

## 2023-10-28 MED ORDER — LIDOCAINE 5 % EX PTCH
1.0000 | MEDICATED_PATCH | Freq: Once | CUTANEOUS | Status: DC
  Administered 2023-10-28: 1 via TRANSDERMAL
  Filled 2023-10-28: qty 1

## 2023-10-28 MED ORDER — PREDNISONE 10 MG (21) PO TBPK
ORAL_TABLET | Freq: Every day | ORAL | 0 refills | Status: DC
Start: 2023-10-28 — End: 2023-12-01

## 2023-10-28 MED ORDER — ONDANSETRON 4 MG PO TBDP
4.0000 mg | ORAL_TABLET | Freq: Once | ORAL | Status: AC
  Administered 2023-10-28: 4 mg via ORAL
  Filled 2023-10-28: qty 1

## 2023-10-28 MED ORDER — LIDOCAINE 5 % EX PTCH: 1.0000 | MEDICATED_PATCH | CUTANEOUS | 0 refills | Status: AC

## 2023-10-28 MED ORDER — OXYCODONE HCL 5 MG PO TABS: 5.0000 mg | ORAL_TABLET | ORAL | 0 refills | Status: DC | PRN

## 2023-10-28 MED ORDER — KETOROLAC TROMETHAMINE 15 MG/ML IJ SOLN
15.0000 mg | Freq: Once | INTRAMUSCULAR | Status: AC
  Administered 2023-10-28: 15 mg via INTRAVENOUS
  Filled 2023-10-28: qty 1

## 2023-10-28 MED ORDER — CYCLOBENZAPRINE HCL 10 MG PO TABS: 10.0000 mg | ORAL_TABLET | Freq: Two times a day (BID) | ORAL | 0 refills | Status: DC | PRN

## 2023-10-28 MED ORDER — ONDANSETRON HCL 4 MG/2ML IJ SOLN
4.0000 mg | Freq: Once | INTRAMUSCULAR | Status: AC
  Administered 2023-10-28: 4 mg via INTRAVENOUS
  Filled 2023-10-28: qty 2

## 2023-10-28 MED ORDER — DIAZEPAM 5 MG PO TABS
5.0000 mg | ORAL_TABLET | Freq: Once | ORAL | Status: AC
  Administered 2023-10-28: 5 mg via ORAL
  Filled 2023-10-28: qty 1

## 2023-10-28 MED ORDER — HYDROMORPHONE HCL 1 MG/ML IJ SOLN
1.0000 mg | Freq: Once | INTRAMUSCULAR | Status: AC
  Administered 2023-10-28: 1 mg via INTRAVENOUS
  Filled 2023-10-28: qty 1

## 2023-10-28 NOTE — ED Triage Notes (Signed)
 Pt complaining of a pain in the left side of her neck that is radiating down the left arm. Started 2 days ago and last night at 2 she couldn't move her neck or her arm.

## 2023-10-28 NOTE — ED Provider Notes (Signed)
 [facility] Provider Note   CSN: [id number] Arrival date & time: 10/28/23  1343     History  Chief Complaint  Patient presents with   Arm Injury    Robin Myers is a 38 y.o. female who presents to the emergency department with a chief complaint of left-sided neck pain that is radiating down her left arm.  Patient states that this started 2 days ago and then her last night at approximately 2 AM she could not move her neck or her arm.  Patient has a past medical history significant for valvular heart disease, migraines, hypertension.   Per chart review patient has a history of cervical radiculopathy.   Arm Injury Associated symptoms: neck pain   Associated symptoms: no fever        Home Medications Prior to Admission medications   Medication Sig Start Date End Date Taking? Authorizing Provider  cyclobenzaprine  (FLEXERIL ) 10 MG tablet Take 1 tablet (10 mg total) by mouth 2 (two) times daily as needed for muscle spasms. 10/28/23  Yes Joannie Medine F, PA-C  lidocaine  (LIDODERM ) 5 % Place 1 patch onto the skin daily. Remove & Discard patch within 12 hours or as directed by MD 10/28/23  Yes Rion Schnitzer F, PA-C  oxyCODONE  (ROXICODONE ) 5 MG immediate release tablet Take 1 tablet (5 mg total) by mouth every 4 (four) hours as needed for severe pain (pain score 7-10). 10/28/23  Yes Saxton Chain F, PA-C  predniSONE  (STERAPRED UNI-PAK 21 TAB) 10 MG (21) TBPK tablet Take by mouth daily. Take 6 tabs by mouth daily  for 2 days, then 5 tabs for 2 days, then 4 tabs for 2 days, then 3 tabs for 2 days, 2 tabs for 2 days, then 1 tab by mouth daily for 2 days 10/28/23  Yes Trinka Keshishyan F, PA-C  amLODipine  (NORVASC ) 10 MG tablet Take 1 tablet (10 mg total) by mouth at bedtime. 07/06/23   Scoggins, Amber, NP  gabapentin  (NEURONTIN ) 300 MG capsule Take 1 capsule (300 mg total) by mouth at bedtime. 07/06/23   Scoggins, Amber, NP  hydrALAZINE   (APRESOLINE ) 50 MG tablet Take 1 tablet (50 mg total) by mouth daily with lunch. 07/06/23   Scoggins, Amber, NP  levonorgestrel  (MIRENA ) 20 MCG/24HR IUD 1 each by Intrauterine route once.    [provider]  meloxicam  (MOBIC ) 7.5 MG tablet Take 1 tablet (7.5 mg total) by mouth 2 (two) times daily after a meal. 07/06/23   Scoggins, Amber, NP  Menthol , Topical Analgesic, (ICY HOT EX) Apply 1 application topically daily as needed (pain).    [provider]  metoprolol  tartrate (LOPRESSOR ) 100 MG tablet Take 1 tablet (100 mg total) by mouth 2 (two) times daily. 07/06/23   Scoggins, Hospital doctor, NP  spironolactone  (ALDACTONE ) 25 MG tablet Take 1 tablet (25 mg total) by mouth daily. 07/06/23   Scoggins, Hospital doctor, NP  VENTOLIN  HFA 108 (90 Base) MCG/ACT inhaler INHALE 2 PUFFS INTO THE LUNGS EVERY 6 HOURS AS NEEDED FOR WHEEZING ORSHORTNESS OF BREATH 06/11/17   Gutierrez, Colleen, CNM  Vitamin D, Ergocalciferol, (DRISDOL) 1.25 MG (50000 UNIT) CAPS capsule Take 50,000 Units by mouth every 7 (seven) days.    [provider]      Allergies    Latex and Onion    Review of Systems   Review of Systems  Constitutional:  Positive for activity change. Negative for chills, diaphoresis and fever.  Eyes:  Negative for visual disturbance.  Respiratory:  Negative for chest tightness.   Gastrointestinal:  Positive for nausea. Negative for diarrhea and vomiting.  Musculoskeletal:  Positive for myalgias, neck pain and neck stiffness.       L-sided neck pain radiating down L arm, numbness and tingling down L-side of neck to 2nd and 3rd finger of left hand   Skin:  Negative for rash and wound.  Neurological:  Positive for weakness and numbness. Negative for seizures, syncope, facial asymmetry, speech difficulty and headaches.    Physical Exam Updated Vital Signs BP (!) 157/113 (BP Location: Right Arm)   Pulse 62   Temp 97.7 F (36.5 C) (Oral)   Resp 16   Ht 5' 6 (1.676 m)   Wt 93 kg   SpO2 100%    BMI 33.09 kg/m  Physical Exam Vitals and nursing note reviewed.  Constitutional:      General: She is awake. She is not in acute distress.    Appearance: Normal appearance. She is not diaphoretic.  HENT:     Head: Normocephalic and atraumatic.  Eyes:     General: No visual field deficit.    Extraocular Movements: Extraocular movements intact.  Neck:     Comments: ROM severely reduced due to pain and stiffness, L side of neck extremely tender to palpation  Cardiovascular:     Rate and Rhythm: Normal rate and regular rhythm.     Heart sounds: Normal heart sounds.  Pulmonary:     Effort: Pulmonary effort is normal. No respiratory distress.     Breath sounds: Normal breath sounds. No wheezing, rhonchi or rales.  Musculoskeletal:        General: Tenderness present.     Cervical back: Tenderness (L-sided neck tenderness) present.     Comments: Upper L extremity tender to palpation around collar bone and shoulder, significantly reduced ROM of L shoulder  Skin:    General: Skin is warm and dry.     Capillary Refill: Capillary refill takes less than 2 seconds.  Neurological:     General: No focal deficit present.     Mental Status: She is alert and oriented to person, place, and time.     Cranial Nerves: No dysarthria or facial asymmetry.     Sensory: Sensation is intact.     Motor: Weakness (L arm and hand weakness) present. No seizure activity.     Coordination: Coordination is intact.  Psychiatric:        Mood and Affect: Mood normal.        Behavior: Behavior normal. Behavior is cooperative.    ED Results / Procedures / Treatments   Labs (all labs ordered are listed, but only abnormal results are displayed) Labs Reviewed  BASIC METABOLIC PANEL WITH GFR - Abnormal; Notable for the following components:      Result Value   Creatinine, Ser 1.01 (*)    All other components within normal limits  CBC WITH DIFFERENTIAL/PLATELET  HCG, QUANTITATIVE, PREGNANCY     EKG None  Radiology MR Cervical Spine Wo Contrast Result Date: 10/28/2023 CLINICAL DATA:  Initial evaluation for cervical radiculopathy, acute infection suspected. EXAM: MRI CERVICAL SPINE WITHOUT CONTRAST TECHNIQUE: Multiplanar, multisequence MR imaging of the cervical spine was performed. No intravenous contrast was administered. COMPARISON:  CT from earlier the same day. FINDINGS: Alignment: Straightening of the normal cervical lordosis. No listhesis or malalignment. Vertebrae: Vertebral body height maintained without acute or chronic fracture. Diffuse loss of normal bone marrow signal, nonspecific but can be seen with  anemia, smoking, obesity, and infiltrative/myelofibrotic marrow processes. No discrete or worrisome osseous lesions. No evidence for osteomyelitis discitis or septic arthritis. Cord: Normal signal and morphology.  No epidural collections. Posterior Fossa, vertebral arteries, paraspinal tissues: Unremarkable. Disc levels: C2-C3: Unremarkable. C3-C4: Mild disc bulge with uncovertebral spurring. No spinal stenosis. Foramina remain patent. C4-C5: Mild disc bulge with uncovertebral spurring. No spinal stenosis. Foramina remain patent. C5-C6: Mild diffuse disc bulge with bilateral uncovertebral spurring. No spinal stenosis. Mild right C6 foraminal narrowing. Left neural foramina remains patent. C6-C7: Mild disc bulge with left greater than right uncovertebral spurring. No spinal stenosis. Foramina remain patent. C7-T1: Minimal uncovertebral spurring without significant disc bulge. No spinal stenosis. Foramina remain patent. IMPRESSION: 1. No MRI evidence for acute infection within the cervical spine. 2. Mild noncompressive disc bulging at C3-4 through C6-7 without significant spinal stenosis. Mild right C6 foraminal narrowing related to disc bulge and uncovertebral disease. No other significant foraminal encroachment within the cervical spine. Electronically Signed   By: Virgia Griffins  M.D.   On: 10/28/2023 19:03   CT Cervical Spine Wo Contrast Result Date: 10/28/2023 CLINICAL DATA:  Left-sided neck pain that radiates down the left arm. EXAM: CT CERVICAL SPINE WITHOUT CONTRAST TECHNIQUE: Multidetector CT imaging of the cervical spine was performed without intravenous contrast. Multiplanar CT image reconstructions were also generated. RADIATION DOSE REDUCTION: This exam was performed according to the departmental dose-optimization program which includes automated exposure control, adjustment of the mA and/or kV according to patient size and/or use of iterative reconstruction technique. COMPARISON:  None Available. FINDINGS: Alignment: There is mild reversal of the normal cervical spinal lordosis. Skull base and vertebrae: No acute fracture. No primary bone lesion or focal pathologic process. Soft tissues and spinal canal: No prevertebral fluid or swelling. No visible canal hematoma. Disc levels: Normal multilevel endplates are seen with normal multilevel intervertebral disc spaces. Normal, bilateral multilevel facet joints are noted. Upper chest: Negative. Other: A 1.7 cm right maxillary sinus polyp versus mucous retention cyst is seen. Bilateral predominantly subcentimeter posterior cervical chain lymph nodes are noted. IMPRESSION: 1. No acute fracture or subluxation in the cervical spine. 2. Mild reversal of the normal cervical spinal lordosis, which may be due to positioning or muscle spasm. 3. 1.7 cm right maxillary sinus polyp versus mucous retention cyst. Electronically Signed   By: Virgle Grime M.D.   On: 10/28/2023 16:28   CT Head Wo Contrast Result Date: 10/28/2023 CLINICAL DATA:  Left-sided neck pain that radiates down the left arm. EXAM: CT HEAD WITHOUT CONTRAST TECHNIQUE: Contiguous axial images were obtained from the base of the skull through the vertex without intravenous contrast. RADIATION DOSE REDUCTION: This exam was performed according to the departmental  dose-optimization program which includes automated exposure control, adjustment of the mA and/or kV according to patient size and/or use of iterative reconstruction technique. COMPARISON:  January 24, 2023 FINDINGS: Brain: No evidence of acute infarction, hemorrhage, hydrocephalus, extra-axial collection or mass lesion/mass effect. Vascular: No hyperdense vessel or unexpected calcification. Skull: Normal. Negative for fracture or focal lesion. Sinuses/Orbits: A 13 mm right maxillary sinus polyp versus mucous retention cyst is seen. Other: None. IMPRESSION: 1. No acute intracranial abnormality. 2. Right maxillary sinus polyp versus mucous retention cyst. Electronically Signed   By: Virgle Grime M.D.   On: 10/28/2023 16:25    Procedures Procedures    Medications Ordered in ED Medications  lidocaine  (LIDODERM ) 5 % 1 patch (1 patch Transdermal Patch Applied 10/28/23 1735)  ketorolac  (TORADOL ) 15 MG/ML  injection 15 mg (15 mg Intravenous Given 10/28/23 1519)  diazepam  (VALIUM ) tablet 5 mg (5 mg Oral Given 10/28/23 1508)  HYDROmorphone  (DILAUDID ) injection 1 mg (1 mg Intravenous Given 10/28/23 1737)  ondansetron  (ZOFRAN ) injection 4 mg (4 mg Intravenous Given 10/28/23 1931)  ondansetron  (ZOFRAN -ODT) disintegrating tablet 4 mg (4 mg Oral Given 10/28/23 2018)    ED Course/ Medical Decision Making/ A&P    Patient presents to the ED for concern of neck pain, left arm pain, numbness and tingling from neck down left arm, this involves an extensive number of treatment options, and is a complaint that carries with it a high risk of complications and morbidity.  The differential diagnosis includes stroke, torticollis, dehydration, new injury, trauma surgery, etc.   Co morbidities that complicate the patient evaluation  Valvular heart disease, history of migraines, hypertension   Lab Tests:  I Ordered, and personally interpreted labs.  The pertinent results include: CBC- unremarkable, BMP- unremarkable  other than slightly elevated creatinine of 1.01, pregnancy negative   Imaging Studies ordered:  I ordered imaging studies including CT head, CT cervical spine I independently visualized and interpreted imaging which showed: CT cervical spine:  IMPRESSION:  1. No acute fracture or subluxation in the cervical spine.  2. Mild reversal of the normal cervical spinal lordosis, which may  be due to positioning or muscle spasm.  3. 1.7 cm right maxillary sinus polyp versus mucous retention cyst.  CT head:  IMPRESSION:  1. No acute intracranial abnormality.  2. Right maxillary sinus polyp versus mucous retention cyst.  MRI cervical spine:  IMPRESSION:  1. No MRI evidence for acute infection within the cervical spine.  2. Mild noncompressive disc bulging at C3-4 through C6-7 without  significant spinal stenosis. Mild right C6 foraminal narrowing  related to disc bulge and uncovertebral disease. No other  significant foraminal encroachment within the cervical spine.   I agree with the radiologist interpretation   Cardiac Monitoring:  The patient was maintained on a cardiac monitor.  I personally viewed and interpreted the cardiac monitored which showed an underlying rhythm of: sinus   Medicines ordered and prescription drug management:  I ordered medication including Toradol  for pain, Valium  for pain I ordered outpatient medications including Flexeril , lidocaine  patch, oxycodone , prednisone  for pain and inflammation and spasm Reevaluation of the patient after these medicines showed that the patient improved I have reviewed the patients home medicines and have made adjustments as needed   Test Considered:  MRI brain: Patient denies majority of stroke-like symptoms over the last 2 days, denies facial droop, dysphagia, visual disturbances.  Numbness and tingling present, but patient has a past medical history significant for cervical radiculopathy.- Declined at this time, patient seen in  coordination with attending physicians Dr. Isaiah Marc and Dr. Tamela Fake   Critical Interventions:  none   Problem List / ED Course:  Left-sided neck, left shoulder, left arm pain, numbness/tingling that has been ongoing for 2 days, denies visual disturbances  Lab work overall unremarkable, CT head and cervical spine for possible muscle spasm, MRI cervical spine ordered after consulting attending Dr. Tamela Fake MRI cervical spine results:  IMPRESSION:  1. No MRI evidence for acute infection within the cervical spine.  2. Mild noncompressive disc bulging at C3-4 through C6-7 without  significant spinal stenosis. Mild right C6 foraminal narrowing  related to disc bulge and uncovertebral disease. No other  significant foraminal encroachment within the cervical spine.  - On physical exam left side of neck and left shoulder  extremely tender to palpation, range of motion severely limited, weakness of left hand presumably due to pain - Outpatient pain/inflammation medications ordered including oxycodone , Lidoderm  Derm patches, Flexeril , prednisone  Dosepak -Return precautions given -Patient discharged   Reevaluation:  After the interventions noted above, I reevaluated the patient and found that they have :improved   Social Determinants of Health:  none   Dispostion:  After consideration of the diagnostic results and the patients response to treatment, I feel that the patient would benefit from outpatient therapy with prescribed medications.  Patient instructed to follow-up with primary care provider within 48 hours for ongoing diagnosis and treatment.  Return precautions given.  Patient discharged.  Work note given.  Click here for ABCD2, HEART and other calculatorsREFRESH Note before signing :1}                              Medical Decision Making Amount and/or Complexity of Data Reviewed Labs: ordered. Radiology: ordered.  Risk Prescription drug  management.          Final Clinical Impression(s) / ED Diagnoses Final diagnoses:  Neck pain  Left arm pain  Numbness and tingling in left arm    Rx / DC Orders ED Discharge Orders          Ordered    lidocaine  (LIDODERM ) 5 %  Every 24 hours        10/28/23 1944    cyclobenzaprine  (FLEXERIL ) 10 MG tablet  2 times daily PRN        10/28/23 1944    oxyCODONE  (ROXICODONE ) 5 MG immediate release tablet  Every 4 hours PRN        10/28/23 1944    predniSONE  (STERAPRED UNI-PAK 21 TAB) 10 MG (21) TBPK tablet  Daily        10/28/23 1944              Fonda Hymen, PA-C 10/28/23 2348    Mordecai Applebaum, MD 11/03/23 1507

## 2023-10-28 NOTE — Discharge Instructions (Addendum)
 It was a pleasure taking care of you today.  Today we evaluated the cause of your left-sided neck pain as well as your left-sided arm pain.  Based on your history, physical exam, labs, and imaging today, there was no clear cause for your pain found.  Nonacute changes of your cervical spine on MRI were found, your MRI results can be found below: IMPRESSION:  1. No MRI evidence for acute infection within the cervical spine.  2. Mild noncompressive disc bulging at C3-4 through C6-7 without  significant spinal stenosis. Mild right C6 foraminal narrowing  related to disc bulge and uncovertebral disease. No other  significant foraminal encroachment within the cervical spine.  Please make your primary care provider aware of these results.  I recommend a follow-up with your primary care provider within 48 hours of discharge.  For your symptoms you have been prescribed outpatient pain medication, steroid, muscle relaxant, and lidocaine  patches.  Please use these medications to help with your pain, muscle spasms, and other symptoms.  Please take as prescribed.  Please remember that for the narcotic pain medicine, it can make you drowsy so please do not drive or operate heavy machinery.  I have also given you a work note.  Please return to the emergency department or seek further medical care if you experience any of the following symptoms including but not limited to chest pain, shortness of breath, unexplained weakness, worsening symptoms, severe pain, dizziness, facial droop, slurred speech.  He is also follow-up with your primary care provider for your elevated blood pressure.

## 2023-12-01 ENCOUNTER — Ambulatory Visit (INDEPENDENT_AMBULATORY_CARE_PROVIDER_SITE_OTHER): Admitting: Internal Medicine

## 2023-12-01 ENCOUNTER — Encounter: Payer: Self-pay | Admitting: Internal Medicine

## 2023-12-01 VITALS — BP 154/100 | HR 111 | Ht 66.0 in | Wt 211.0 lb

## 2023-12-01 DIAGNOSIS — E782 Mixed hyperlipidemia: Secondary | ICD-10-CM

## 2023-12-01 DIAGNOSIS — M797 Fibromyalgia: Secondary | ICD-10-CM

## 2023-12-01 DIAGNOSIS — E559 Vitamin D deficiency, unspecified: Secondary | ICD-10-CM

## 2023-12-01 DIAGNOSIS — S161XXD Strain of muscle, fascia and tendon at neck level, subsequent encounter: Secondary | ICD-10-CM | POA: Diagnosis not present

## 2023-12-01 DIAGNOSIS — I10 Essential (primary) hypertension: Secondary | ICD-10-CM | POA: Diagnosis not present

## 2023-12-01 DIAGNOSIS — R0602 Shortness of breath: Secondary | ICD-10-CM

## 2023-12-01 DIAGNOSIS — N921 Excessive and frequent menstruation with irregular cycle: Secondary | ICD-10-CM

## 2023-12-01 DIAGNOSIS — R5383 Other fatigue: Secondary | ICD-10-CM

## 2023-12-01 MED ORDER — AMLODIPINE BESYLATE 10 MG PO TABS: 10.0000 mg | ORAL_TABLET | Freq: Every evening | ORAL | 1 refills | Status: AC

## 2023-12-01 MED ORDER — SPIRONOLACTONE 25 MG PO TABS: 25.0000 mg | ORAL_TABLET | Freq: Every day | ORAL | 1 refills | Status: AC

## 2023-12-01 MED ORDER — BACLOFEN 10 MG PO TABS: 10.0000 mg | ORAL_TABLET | Freq: Two times a day (BID) | ORAL | 2 refills | Status: AC

## 2023-12-01 MED ORDER — METOPROLOL TARTRATE 100 MG PO TABS: 100.0000 mg | ORAL_TABLET | Freq: Two times a day (BID) | ORAL | 3 refills | Status: AC

## 2023-12-01 MED ORDER — HYDRALAZINE HCL 50 MG PO TABS: 50.0000 mg | ORAL_TABLET | Freq: Every day | ORAL | 1 refills | Status: DC

## 2023-12-01 MED ORDER — GABAPENTIN 300 MG PO CAPS: 300.0000 mg | ORAL_CAPSULE | Freq: Every day | ORAL | 1 refills | Status: AC

## 2023-12-01 MED ORDER — VITAMIN D (ERGOCALCIFEROL) 1.25 MG (50000 UNIT) PO CAPS: 50000.0000 [IU] | ORAL_CAPSULE | ORAL | 3 refills | Status: AC
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
## 2023-12-02 LAB — CBC WITH DIFFERENTIAL/PLATELET
Basophils Absolute: 0.1 x10E3/uL (ref 0.0–0.2)
Basos: 1 %
EOS (ABSOLUTE): 0.1 x10E3/uL (ref 0.0–0.4)
Eos: 1 %
Hematocrit: 38.1 % (ref 34.0–46.6)
Hemoglobin: 12 g/dL (ref 11.1–15.9)
Immature Grans (Abs): 0 x10E3/uL (ref 0.0–0.1)
Immature Granulocytes: 0 %
Lymphocytes Absolute: 2.9 x10E3/uL (ref 0.7–3.1)
Lymphs: 35 %
MCH: 26.8 pg (ref 26.6–33.0)
MCHC: 31.5 g/dL (ref 31.5–35.7)
MCV: 85 fL (ref 79–97)
Monocytes Absolute: 0.3 x10E3/uL (ref 0.1–0.9)
Monocytes: 4 %
Neutrophils Absolute: 4.9 x10E3/uL (ref 1.4–7.0)
Neutrophils: 59 %
Platelets: 359 x10E3/uL (ref 150–450)
RBC: 4.48 x10E6/uL (ref 3.77–5.28)
RDW: 14 % (ref 11.7–15.4)
WBC: 8.3 x10E3/uL (ref 3.4–10.8)

## 2023-12-02 LAB — TSH+T4F+T3FREE
Free T4: 1.16 ng/dL (ref 0.82–1.77)
T3, Free: 3.1 pg/mL (ref 2.0–4.4)
TSH: 1.51 u[IU]/mL (ref 0.450–4.500)

## 2023-12-02 LAB — CMP14+EGFR
ALT: 15 IU/L (ref 0–32)
AST: 19 IU/L (ref 0–40)
Albumin: 4.3 g/dL (ref 3.9–4.9)
Alkaline Phosphatase: 128 IU/L — ABNORMAL HIGH (ref 44–121)
BUN/Creatinine Ratio: 14 (ref 9–23)
BUN: 17 mg/dL (ref 6–20)
Bilirubin Total: <0.2 mg/dL (ref 0.0–1.2)
CO2: 22 mmol/L (ref 20–29)
Calcium: 9.8 mg/dL (ref 8.7–10.2)
Chloride: 104 mmol/L (ref 96–106)
Creatinine, Ser: 1.19 mg/dL — ABNORMAL HIGH (ref 0.57–1.00)
Globulin, Total: 2.6 g/dL (ref 1.5–4.5)
Glucose: 110 mg/dL — ABNORMAL HIGH (ref 70–99)
Potassium: 4 mmol/L (ref 3.5–5.2)
Sodium: 141 mmol/L (ref 134–144)
Total Protein: 6.9 g/dL (ref 6.0–8.5)
eGFR: 60 mL/min/1.73 (ref >59)

## 2023-12-02 LAB — LIPID PANEL W/O CHOL/HDL RATIO
Cholesterol, Total: 224 mg/dL — ABNORMAL HIGH (ref 100–199)
HDL: 50 mg/dL (ref >39)
LDL Chol Calc (NIH): 152 mg/dL — ABNORMAL HIGH (ref 0–99)
Triglycerides: 123 mg/dL (ref 0–149)
VLDL Cholesterol Cal: 22 mg/dL (ref 5–40)

## 2023-12-03 ENCOUNTER — Ambulatory Visit: Payer: Self-pay | Admitting: Internal Medicine

## 2023-12-03 ENCOUNTER — Encounter: Payer: Self-pay | Admitting: Internal Medicine

## 2023-12-03 ENCOUNTER — Ambulatory Visit (INDEPENDENT_AMBULATORY_CARE_PROVIDER_SITE_OTHER): Admitting: Internal Medicine

## 2023-12-03 VITALS — BP 146/90 | HR 104 | Ht 66.0 in | Wt 210.0 lb

## 2023-12-03 DIAGNOSIS — E559 Vitamin D deficiency, unspecified: Secondary | ICD-10-CM | POA: Diagnosis not present

## 2023-12-03 DIAGNOSIS — R7309 Other abnormal glucose: Secondary | ICD-10-CM | POA: Diagnosis not present

## 2023-12-03 DIAGNOSIS — I10 Essential (primary) hypertension: Secondary | ICD-10-CM | POA: Diagnosis not present

## 2023-12-03 DIAGNOSIS — M797 Fibromyalgia: Secondary | ICD-10-CM

## 2023-12-03 DIAGNOSIS — E782 Mixed hyperlipidemia: Secondary | ICD-10-CM

## 2023-12-03 MED ORDER — ROSUVASTATIN CALCIUM 10 MG PO TABS: 10.0000 mg | ORAL_TABLET | Freq: Every day | ORAL | 11 refills | Status: AC
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
## 2023-12-10 ENCOUNTER — Ambulatory Visit: Admitting: Internal Medicine

## 2023-12-10 ENCOUNTER — Encounter: Payer: Self-pay | Admitting: Internal Medicine

## 2023-12-10 VITALS — BP 148/88 | HR 111 | Ht 66.0 in | Wt 207.0 lb

## 2023-12-10 DIAGNOSIS — E782 Mixed hyperlipidemia: Secondary | ICD-10-CM

## 2023-12-10 DIAGNOSIS — I10 Essential (primary) hypertension: Secondary | ICD-10-CM

## 2023-12-10 DIAGNOSIS — M797 Fibromyalgia: Secondary | ICD-10-CM

## 2023-12-10 DIAGNOSIS — S161XXD Strain of muscle, fascia and tendon at neck level, subsequent encounter: Secondary | ICD-10-CM

## 2023-12-10 DIAGNOSIS — R5383 Other fatigue: Secondary | ICD-10-CM | POA: Diagnosis not present

## 2023-12-10 MED ORDER — HYDRALAZINE HCL 50 MG PO TABS: 50.0000 mg | ORAL_TABLET | Freq: Two times a day (BID) | ORAL | 1 refills | Status: AC
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
## 2024-01-14 ENCOUNTER — Encounter: Admitting: Obstetrics

## 2024-01-21 ENCOUNTER — Ambulatory Visit: Admitting: Internal Medicine
